# Patient Record
Sex: Female | Born: 1940 | Race: White | Hispanic: No | Smoking: Never smoker
Health system: Southern US, Community
[De-identification: ages and names within clinical notes are randomized; demographics above are authoritative.]

## PROBLEM LIST (undated history)

## (undated) DIAGNOSIS — H9190 Unspecified hearing loss, unspecified ear: Secondary | ICD-10-CM

## (undated) DIAGNOSIS — IMO0001 Reserved for inherently not codable concepts without codable children: Secondary | ICD-10-CM

## (undated) DIAGNOSIS — E785 Hyperlipidemia, unspecified: Secondary | ICD-10-CM

## (undated) DIAGNOSIS — E079 Disorder of thyroid, unspecified: Secondary | ICD-10-CM

## (undated) DIAGNOSIS — I10 Essential (primary) hypertension: Secondary | ICD-10-CM

---

## 2010-03-08 ENCOUNTER — Ambulatory Visit: Payer: Self-pay | Admitting: Family Medicine

## 2010-03-08 DIAGNOSIS — T6391XA Toxic effect of contact with unspecified venomous animal, accidental (unintentional), initial encounter: Secondary | ICD-10-CM | POA: Insufficient documentation

## 2010-03-08 DIAGNOSIS — E785 Hyperlipidemia, unspecified: Secondary | ICD-10-CM | POA: Insufficient documentation

## 2010-03-08 DIAGNOSIS — IMO0002 Reserved for concepts with insufficient information to code with codable children: Secondary | ICD-10-CM

## 2010-03-08 DIAGNOSIS — I10 Essential (primary) hypertension: Secondary | ICD-10-CM | POA: Insufficient documentation

## 2010-03-08 DIAGNOSIS — E119 Type 2 diabetes mellitus without complications: Secondary | ICD-10-CM

## 2010-03-10 ENCOUNTER — Telehealth (INDEPENDENT_AMBULATORY_CARE_PROVIDER_SITE_OTHER): Payer: Self-pay | Admitting: *Deleted

## 2010-08-01 NOTE — Assessment & Plan Note (Signed)
Summary: MULTI BEE STINGS ON RIGHT ARM/TJ (4)   Vital Signs:  Patient Profile:   70 Years Old Female CC:      bee stings to right FA and right middle finger x 2 days Height:     65 inches Weight:      131 pounds O2 Sat:      99 % O2 treatment:    Room Air Temp:     97.9 degrees F oral Pulse rate:   92 / minute Resp:     14 per minute BP sitting:   142 / 77  (left arm) Cuff size:   regular  Pt. in pain?   no  Vitals Entered By: Lajean Saver RN (March 08, 2010 11:30 AM)                   Updated Prior Medication List: LEVOTHYROXINE SODIUM 50 MCG TABS (LEVOTHYROXINE SODIUM) once daily LISINOPRIL 10 MG TABS (LISINOPRIL)  SIMVASTATIN 20 MG TABS (SIMVASTATIN)  METFORMIN HCL 500 MG TABS (METFORMIN HCL)  SERTRALINE HCL 50 MG TABS (SERTRALINE HCL)   Current Allergies: ! * SEASONALHistory of Present Illness Chief Complaint: bee stings to right FA and right middle finger x 2 days History of Present Illness:  Subjective:  Patient complains of several bee stings on the right forearm two days ago.  The area has remained red and continues to itch.  No improvement with Benadryl.  No swelling in tongue, difficulty swallowing, or shortness of breath.  No fevers, chills, and sweats  She does not remember her last tetanus shot.  REVIEW OF SYSTEMS Constitutional Symptoms      Denies fever, chills, night sweats, weight loss, weight gain, and fatigue.  Eyes       Denies change in vision, eye pain, eye discharge, glasses, contact lenses, and eye surgery. Ear/Nose/Throat/Mouth       Denies hearing loss/aids, change in hearing, ear pain, ear discharge, dizziness, frequent runny nose, frequent nose bleeds, sinus problems, sore throat, hoarseness, and tooth pain or bleeding.  Respiratory       Denies dry cough, productive cough, wheezing, shortness of breath, asthma, bronchitis, and emphysema/COPD.  Cardiovascular       Denies murmurs, chest pain, and tires easily with exhertion.     Gastrointestinal       Denies stomach pain, nausea/vomiting, diarrhea, constipation, blood in bowel movements, and indigestion. Genitourniary       Denies painful urination, kidney stones, and loss of urinary control. Neurological       Denies paralysis, seizures, and fainting/blackouts. Musculoskeletal       Denies muscle pain, joint pain, joint stiffness, decreased range of motion, redness, swelling, muscle weakness, and gout.  Skin       Denies bruising, unusual mles/lumps or sores, and hair/skin or nail changes.      Comments: bee stings Psych       Denies mood changes, temper/anger issues, anxiety/stress, speech problems, depression, and sleep problems. Other Comments: denies SOB or nausea or dizziness. C/o itching on right FA and right middle finger. Redness and swelling noted   Past History:  Past Medical History: Diabetes mellitus, type II Hypertension Hyperlipidemia  Social History: Never Smoked Alcohol use-no Drug use-no Smoking Status:  never Drug Use:  no   Objective:  Appearance:  No acute distress.  She is alert and oriented  Skin:  right volar forearm:   approximately 5cm by 15cm patch of erythema, slightly warm and nontender; no swelling. Eyes:  Pupils are equal, round, and reactive to light and accomdation.  Extraocular movement is intact.  Conjunctivae are not inflamed.  Mouth:  moist mucous membranes  Neck:  No adenopathy Lungs:  Clear to auscultation.  Breath sounds are equal.  Heart:  Regular rate and rhythm without murmurs, rubs, or gallops.  Assessment New Problems: CELLULITIS, ARM (ICD-682.3) BEE STING REACTION, LOCAL (ICD-989.5) HYPERLIPIDEMIA (ICD-272.4) HYPERTENSION (ICD-401.9) DIABETES MELLITUS, TYPE II (ICD-250.00)   Plan New Medications/Changes: CEPHALEXIN 500 MG CAPS (CEPHALEXIN) One by mouth two times a day  #14 x 0, 03/08/2010, Donna Christen MD  New Orders: New Patient Level III [99203] Tdap => 2yrs IM [90715] Admin 1st  Vaccine [90471] Planning Comments:   Tdap given. Begin Keflex.  Continue Benadryl (avoid sun exposure).  Continue cool compresses. Follow-up with PCP if not improving 3 to 5 days.   The patient and/or caregiver has been counseled thoroughly with regard to medications prescribed including dosage, schedule, interactions, rationale for use, and possible side effects and they verbalize understanding.  Diagnoses and expected course of recovery discussed and will return if not improved as expected or if the condition worsens. Patient and/or caregiver verbalized understanding.  Prescriptions: CEPHALEXIN 500 MG CAPS (CEPHALEXIN) One by mouth two times a day  #14 x 0   Entered and Authorized by:   Donna Christen MD   Signed by:   Donna Christen MD on 03/08/2010   Method used:   Print then Give to Patient   RxID:   [id number]   Patient Instructions: 1)  Continue taking Benadryl for itching.  2)  Continue applying cool compress several times daily. 3)  Followup with primary care doctor if not improving 3 to 5 days.  Orders Added: 1)  New Patient Level III [99203] 2)  Tdap => 32yrs IM [90715] 3)  Admin 1st Vaccine [90471]   Immunizations Administered:  Tetanus Vaccine:    Vaccine Type: Tdap    Site: left deltoid    Mfr: Gl[employer]oSmith[employer]    Dose: 0.5 ml    Route: IM    Given by: Lajean Saver RN    Exp. Date: 12/30/2011    Lot #: [id number]    VIS given: 05/20/07 version given March 08, 2010.

## 2010-08-01 NOTE — Miscellaneous (Signed)
Summary: TDAP FORM  TDAP FORM   Imported By: Dannette Barbara 03/08/2010 12:39:39  _____________________________________________________________________  External Attachment:    Type:   Image     Comment:   External Document

## 2010-08-01 NOTE — Progress Notes (Signed)
  Phone Note Outgoing Call Call back at [facility] Phone [phone]   Call placed by: Lajean Saver RN,  March 10, 2010 4:25 PM Call placed to: Patient  Follow-up for Phone Call        Phone call completed Patient is improving and is still taking medication prescribed.  Follow-up by: Lajean Saver RN,  March 10, 2010 4:26 PM

## 2010-12-12 ENCOUNTER — Inpatient Hospital Stay (INDEPENDENT_AMBULATORY_CARE_PROVIDER_SITE_OTHER)
Admission: RE | Admit: 2010-12-12 | Discharge: 2010-12-12 | Disposition: A | Discharge status: Home / Self Care | Payer: Medicare Other | Source: Ambulatory Visit | Attending: Emergency Medicine | Admitting: Emergency Medicine

## 2010-12-12 ENCOUNTER — Encounter: Payer: Self-pay | Admitting: Emergency Medicine

## 2010-12-12 DIAGNOSIS — L259 Unspecified contact dermatitis, unspecified cause: Secondary | ICD-10-CM | POA: Insufficient documentation

## 2010-12-12 DIAGNOSIS — IMO0002 Reserved for concepts with insufficient information to code with codable children: Secondary | ICD-10-CM

## 2011-02-10 ENCOUNTER — Encounter: Payer: Self-pay | Admitting: Family Medicine

## 2011-02-10 ENCOUNTER — Inpatient Hospital Stay (INDEPENDENT_AMBULATORY_CARE_PROVIDER_SITE_OTHER)
Admission: RE | Admit: 2011-02-10 | Discharge: 2011-02-10 | Disposition: A | Discharge status: Home / Self Care | Payer: Medicare Other | Source: Ambulatory Visit | Attending: Emergency Medicine | Admitting: Emergency Medicine

## 2011-02-10 ENCOUNTER — Inpatient Hospital Stay: Admission: RE | Admit: 2011-02-10 | Payer: Medicare Other | Source: Ambulatory Visit | Admitting: Family Medicine

## 2011-02-10 DIAGNOSIS — J069 Acute upper respiratory infection, unspecified: Secondary | ICD-10-CM

## 2011-06-04 NOTE — Progress Notes (Signed)
Summary: COUGH/SORE THROAT(rm4)   Vital Signs:  Patient Profile:   70 Years Old Female CC:      cough-sore throat Height:     65 inches Weight:      135.4 pounds O2 Sat:      95 % O2 treatment:    Room Air Temp:     99.7 degrees F oral Pulse rate:   100 / minute Resp:     20 per minute BP sitting:   151 / 84  (right arm) Cuff size:   coughregular  Vitals Entered By: Linton Flemings RN (February 10, 2011 10:02 AM)                  Updated Prior Medication List: LEVOTHYROXINE SODIUM 50 MCG TABS (LEVOTHYROXINE SODIUM) once daily LISINOPRIL 10 MG TABS (LISINOPRIL)  SIMVASTATIN 20 MG TABS (SIMVASTATIN)  METFORMIN HCL 500 MG TABS (METFORMIN HCL)  SERTRALINE HCL 50 MG TABS (SERTRALINE HCL)  ALENDRONATE SODIUM 70 MG TABS (ALENDRONATE SODIUM)  LOVASTATIN 20 MG TABS (LOVASTATIN)  TRIAMCINOLONE ACETONIDE 0.1 % CREA (TRIAMCINOLONE ACETONIDE) apply to affected area two times a day VESICARE 5 MG TABS (SOLIFENACIN SUCCINATE)   Current Allergies: ! * SEASONALHistory of Present Illness Chief Complaint: cough-sore throat History of Present Illness: 70 yo F here for 3 days of dry coughsore throat, runny nose.  Has a friend with similar symptoms who was diagnosed with bronchitis.  No fever, chills, chest pain, wheezing.  + mild ear pressure.  No other complaints.  REVIEW OF SYSTEMS Constitutional Symptoms      Denies fever, chills, night sweats, weight loss, weight gain, and fatigue.  Eyes       Complains of glasses.      Denies change in vision, eye pain, eye discharge, contact lenses, and eye surgery. Ear/Nose/Throat/Mouth       Complains of change in hearing, frequent runny nose, sinus problems, and sore throat.      Denies hearing loss/aids, ear pain, ear discharge, dizziness, frequent nose bleeds, hoarseness, and tooth pain or bleeding.  Respiratory       Complains of dry cough.      Denies productive cough, wheezing, shortness of breath, asthma, bronchitis, and emphysema/COPD.    Cardiovascular       Denies murmurs, chest pain, and tires easily with exhertion.    Gastrointestinal       Denies stomach pain, nausea/vomiting, diarrhea, constipation, blood in bowel movements, and indigestion. Genitourniary       Denies painful urination, kidney stones, and loss of urinary control. Neurological       Denies paralysis, seizures, and fainting/blackouts. Musculoskeletal       Denies muscle pain, joint pain, joint stiffness, decreased range of motion, redness, swelling, muscle weakness, and gout.  Skin       Denies bruising, unusual mles/lumps or sores, and hair/skin or nail changes.  Psych       Complains of depression.      Denies mood changes, temper/anger issues, anxiety/stress, speech problems, and sleep problems. Other Comments: cough x 3 days- denies [facility]   Past History:  Past Medical History: Reviewed history from 12/12/2010 and no changes required. Diabetes mellitus, type II Hypertension Hyperlipidemia over active bladder  Past Surgical History: Reviewed history from 12/12/2010 and no changes required. Denies surgical history Physical Exam General appearance: well developed, well nourished, no acute distress, sniffling Head: normocephalic, atraumatic Eyes: conjunctivae and lids normal Ears: normal, no lesions or deformities Nasal: mucus drainage Oral/Pharynx: tongue normal, posterior  pharynx without erythema or exudate Neck: neck supple,  trachea midline, no masses or LAN Chest/Lungs: no rales, wheezes, or rhonchi bilateral, breath sounds equal without effort Heart: regular rate and  rhythm, no murmur Assessment New Problems: URI (ICD-465.9)  URI  Plan New Orders: Est. Patient Level III [40981] Planning Comments:   discussed OTC meds - avoid sudafed with high blood pressure.  If not improving after total 7 days or if gets second sickening, to fill azithromycin and take as directed.  tessalon as needed for cough.  Humidifier.  See  instructions for further.   The patient and/or caregiver has been counseled thoroughly with regard to medications prescribed including dosage, schedule, interactions, rationale for use, and possible side effects and they verbalize understanding.  Diagnoses and expected course of recovery discussed and will return if not improved as expected or if the condition worsens. Patient and/or caregiver verbalized understanding.   Patient Instructions: 1)  In most cases, your symptoms (bronchitis, cold) are caused by a viral infection that will resolve on its own over time. 2)  Afrin up to 5 days may help with congestion. 3)  Use mucinex or robitussin DM for cough.  Tessalon perles is a prescription cough medicine as well.  4)  Sudafed may be helpful if you don't have high blood pressure. 5)  Drink plenty of fluids and stay hydrated! 6)  Antibiotics are not helpful for viral infections.   7)  But if not improving after a total of 7 days or if you start to improve then worsen again, fill the azithromycin. 8)  Wash your hands frequently. 9)  Symptoms usually last 3-7 days but can last up to 2-3 weeks.  Orders Added: 1)  Est. Patient Level III [19147]

## 2011-06-04 NOTE — Progress Notes (Signed)
Summary: rash rm 5   Vital Signs:  Patient Profile:   70 Years Old Female CC:      rash x 2 days Height:     65 inches Weight:      136 pounds O2 Sat:      98 % O2 treatment:    Room Air Temp:     97.8 degrees F oral Pulse rate:   86 / minute Resp:     16 per minute BP sitting:   146 / 79  (left arm) Cuff size:   regular  Vitals Entered By: Clemens Catholic LPN (December 12, 2010 5:43 PM)                  Updated Prior Medication List: LEVOTHYROXINE SODIUM 50 MCG TABS (LEVOTHYROXINE SODIUM) once daily LISINOPRIL 10 MG TABS (LISINOPRIL)  SIMVASTATIN 20 MG TABS (SIMVASTATIN)  METFORMIN HCL 500 MG TABS (METFORMIN HCL)  SERTRALINE HCL 50 MG TABS (SERTRALINE HCL)  ALENDRONATE SODIUM 70 MG TABS (ALENDRONATE SODIUM)  LOVASTATIN 20 MG TABS (LOVASTATIN)   Current Allergies (reviewed today): ! * SEASONALHistory of Present Illness History from: patient Chief Complaint: rash x 2 days History of Present Illness: She was working in her flower garden pulling weeds a few days ago, then developed a rash on the R side of her neck and R hand.  Mildly itchy, not painful.  No F/C/N/V.  Other than the rash, she has no concerns.  She is not taking any OTC meds.  No new soaps, detergents, shampoos, clothes.  She has started Vesicare in the last few weeks but no other new meds.  REVIEW OF SYSTEMS Constitutional Symptoms      Denies fever, chills, night sweats, weight loss, weight gain, and fatigue.  Eyes       Complains of glasses.      Denies change in vision, eye pain, eye discharge, contact lenses, and eye surgery. Ear/Nose/Throat/Mouth       Complains of change in hearing and sinus problems.      Denies hearing loss/aids, ear pain, ear discharge, dizziness, frequent runny nose, frequent nose bleeds, sore throat, hoarseness, and tooth pain or bleeding.  Respiratory       Denies dry cough, productive cough, wheezing, shortness of breath, asthma, bronchitis, and emphysema/COPD.  Cardiovascular      Denies murmurs, chest pain, and tires easily with exhertion.    Gastrointestinal       Denies stomach pain, nausea/vomiting, diarrhea, constipation, blood in bowel movements, and indigestion. Genitourniary       Denies painful urination, kidney stones, and loss of urinary control. Neurological       Denies paralysis, seizures, and fainting/blackouts. Musculoskeletal       Denies muscle pain, joint pain, joint stiffness, decreased range of motion, redness, swelling, muscle weakness, and gout.  Skin       Denies bruising, unusual mles/lumps or sores, and hair/skin or nail changes.  Psych       Complains of anxiety/stress and depression.      Denies mood changes, temper/anger issues, speech problems, and sleep problems. Other Comments: pt c/o rash on her face and neck x 2 days. no OTC meds.   Past History:  Past Medical History: Diabetes mellitus, type II Hypertension Hyperlipidemia over active bladder  Past Surgical History: Denies surgical history  Social History: Reviewed history from 03/08/2010 and no changes required. Never Smoked Alcohol use-no Drug use-no Physical Exam General appearance: well developed, well nourished, no acute distress Skin:  Slightly erythematous rash on the R side of her neck, on her dorsal R hand, and on her R forearm. No signs of infection or cellulitis MSE: oriented to time, place, and person.  She has a baseline speech impediment Assessment New Problems: CONTACT DERMATITIS (ICD-692.9)   Plan New Medications/Changes: TRIAMCINOLONE ACETONIDE 0.1 % CREA (TRIAMCINOLONE ACETONIDE) apply to affected area two times a day  #QS x3 wks x 0, 12/12/2010, Hoyt Koch MD  New Orders: Est. Patient Level III [phone] Planning Comments:   Her rash is very localized, so I think we can try a steroid cream first.  Avoid use on her face.  If worsening, call your PCP or dermatologist to consider prednisone or other further treatment.   The patient  and/or caregiver has been counseled thoroughly with regard to medications prescribed including dosage, schedule, interactions, rationale for use, and possible side effects and they verbalize understanding.  Diagnoses and expected course of recovery discussed and will return if not improved as expected or if the condition worsens. Patient and/or caregiver verbalized understanding.  Prescriptions: TRIAMCINOLONE ACETONIDE 0.1 % CREA (TRIAMCINOLONE ACETONIDE) apply to affected area two times a day  #QS x3 wks x 0   Entered and Authorized by:   Hoyt Koch MD   Signed by:   Hoyt Koch MD on 12/12/2010   Method used:   Print then Give to Patient   RxID:   [id number]   Orders Added: 1)  Est. Patient Level III [78469]

## 2012-02-03 ENCOUNTER — Emergency Department (INDEPENDENT_AMBULATORY_CARE_PROVIDER_SITE_OTHER)
Admission: EM | Admit: 2012-02-03 | Discharge: 2012-02-03 | Disposition: A | Discharge status: Home / Self Care | Payer: Medicare Other | Source: Home / Self Care | Attending: Family Medicine | Admitting: Family Medicine

## 2012-02-03 ENCOUNTER — Emergency Department (INDEPENDENT_AMBULATORY_CARE_PROVIDER_SITE_OTHER): Payer: Medicare Other

## 2012-02-03 DIAGNOSIS — M25562 Pain in left knee: Secondary | ICD-10-CM

## 2012-02-03 DIAGNOSIS — M25569 Pain in unspecified knee: Secondary | ICD-10-CM

## 2012-02-03 DIAGNOSIS — M7989 Other specified soft tissue disorders: Secondary | ICD-10-CM

## 2012-02-03 HISTORY — DX: Disorder of thyroid, unspecified: E07.9

## 2012-02-03 HISTORY — DX: Hyperlipidemia, unspecified: E78.5

## 2012-02-03 HISTORY — DX: Essential (primary) hypertension: I10

## 2012-02-03 MED ORDER — NYSTATIN 100000 UNIT/GM EX OINT: TOPICAL_OINTMENT | Freq: Two times a day (BID) | CUTANEOUS | Status: DC

## 2012-02-03 MED ORDER — NAPROXEN 250 MG PO TABS: 250.0000 mg | ORAL_TABLET | Freq: Two times a day (BID) | ORAL | Status: AC

## 2012-02-03 MED ORDER — CEPHALEXIN 500 MG PO CAPS: 500.0000 mg | ORAL_CAPSULE | Freq: Three times a day (TID) | ORAL | Status: DC

## 2012-02-03 NOTE — ED Provider Notes (Signed)
History     CSN: [id number]  Arrival date & time 02/03/12  1503   First MD Initiated Contact with Patient 02/03/12 1532      Chief Complaint  Patient presents with  . Knee Pain    3 days      HPI Comments: Alishea complains of left knee pain for 3 days. She states she injured her knee 6 months ago.   Patient is a 71 y.o. female presenting with knee pain. The history is provided by the patient.  Knee Pain This is a new problem. Episode onset: 3 days ago. The problem occurs constantly. The problem has not changed since onset.Associated symptoms comments: none. The symptoms are aggravated by bending and walking. Nothing relieves the symptoms. She has tried acetaminophen for the symptoms. The treatment provided no relief.    Past Medical History  Diagnosis Date  . Hypertension   . Thyroid disease   . Hyperlipidemia   . Diabetes mellitus     History reviewed. No pertinent past surgical history.  Family History  Problem Relation Age of Onset  . Heart disease Mother   . Heart disease Father     History  Substance Use Topics  . Smoking status: Never Smoker   . Smokeless tobacco: Never Used  . Alcohol Use: No    OB History    Grav Para Term Preterm Abortions TAB SAB Ect Mult Living                  Review of Systems  All other systems reviewed and are negative.    Allergies  Lipitor  Home Medications   Current Outpatient Rx  Name Route Sig Dispense Refill  . ALENDRONATE SODIUM 70 MG PO TABS Oral Take 70 mg by mouth every 7 (seven) days. Take with a full glass of water on an empty stomach.    Marland Kitchen BRIMONIDINE TARTRATE 0.1 % OP SOLN Both Eyes Place 1 drop into both eyes 2 (two) times daily.    Marland Kitchen CALCIUM CARBONATE 600 MG PO TABS Oral Take 600 mg by mouth daily.    Marland Kitchen LEVOTHYROXINE SODIUM 50 MCG PO TABS Oral Take 50 mcg by mouth daily.    Marland Kitchen LISINOPRIL 20 MG PO TABS Oral Take 20 mg by mouth daily.    Marland Kitchen LOVASTATIN 20 MG PO TABS Oral Take 20 mg by mouth at bedtime.    Marland Kitchen  METFORMIN HCL ER (OSM) 500 MG PO TB24 Oral Take 500 mg by mouth daily with breakfast.    . NAPROXEN 250 MG PO TABS Oral Take 1 tablet (250 mg total) by mouth 2 (two) times daily with a meal. 14 tablet 0    BP 142/78  Pulse 87  Temp 97.9 F (36.6 C) (Oral)  Resp 16  Ht 5\' 5"  (1.651 m)  Wt 133 lb (60.328 kg)  BMI 22.13 kg/m2  SpO2 100%  Physical Exam  Nursing note and vitals reviewed. Constitutional: She is oriented to person, place, and time. She appears well-developed and well-nourished. No distress.  HENT:  Head: Normocephalic.  Eyes: Conjunctivae are normal. Pupils are equal, round, and reactive to light.  Cardiovascular: Normal heart sounds.   Pulmonary/Chest: Breath sounds normal.  Musculoskeletal: Normal range of motion. She exhibits tenderness. She exhibits no edema.       Left knee: She exhibits swelling. She exhibits normal range of motion, no effusion, no ecchymosis, no deformity, no laceration, no erythema, normal alignment, no LCL laxity, normal patellar mobility, no bony tenderness,  normal meniscus and no MCL laxity. tenderness found. Medial joint line tenderness noted. No lateral joint line, no MCL, no LCL and no patellar tendon tenderness noted.       Legs:      Left knee has mild tenderness over the medial joint line.  There is tenderness with valgus stress.  Negative drawer.  Negative McMurray.  No calf tenderness.  Distal Neurovascular function is intact.   Neurological: She is alert and oriented to person, place, and time.  Skin: Skin is warm and dry. No rash noted.    ED Course  Procedures  none  Labs Reviewed - No data to display Dg Knee 2 Views Left  02/03/2012  *RADIOLOGY REPORT*  Clinical Data: Knee pain for past 6-7 months.  LEFT KNEE - 1-2 VIEW  Comparison: No priors.  Findings: AP and lateral views of the left knee demonstrates soft tissue swelling medial to the knee joint.  No acute fracture, subluxation or dislocation is appreciated.  IMPRESSION: 1.  Soft  tissue swelling medial to the knee joint without definite underlying bony abnormality.  Original Report Authenticated By: Florencia Reasons, M.D.     1. Left knee pain; suspect osteoarthritis       MDM  Neoprene knee sleeve applied.  Begin Naproxen Apply ice pack 3 or 4 times daily for about 20 minutes.  Wear knee brace daytime. Followup with orthopedist if not improved 2 weeks.        Lattie Haw, MD 02/06/12 [phone]

## 2012-02-03 NOTE — ED Notes (Signed)
Telia complains of left knee pain for 3 days. She states she injured her knee 6 months ago.

## 2013-02-07 ENCOUNTER — Emergency Department (INDEPENDENT_AMBULATORY_CARE_PROVIDER_SITE_OTHER)
Admission: EM | Admit: 2013-02-07 | Discharge: 2013-02-07 | Disposition: A | Discharge status: Home / Self Care | Payer: Medicare Other | Source: Home / Self Care | Attending: Family Medicine | Admitting: Family Medicine

## 2013-02-07 ENCOUNTER — Emergency Department (INDEPENDENT_AMBULATORY_CARE_PROVIDER_SITE_OTHER): Payer: Medicare Other

## 2013-02-07 ENCOUNTER — Encounter: Payer: Self-pay | Admitting: *Deleted

## 2013-02-07 DIAGNOSIS — S52501A Unspecified fracture of the lower end of right radius, initial encounter for closed fracture: Secondary | ICD-10-CM

## 2013-02-07 DIAGNOSIS — S52599A Other fractures of lower end of unspecified radius, initial encounter for closed fracture: Secondary | ICD-10-CM

## 2013-02-07 DIAGNOSIS — S52509A Unspecified fracture of the lower end of unspecified radius, initial encounter for closed fracture: Secondary | ICD-10-CM

## 2013-02-07 DIAGNOSIS — W19XXXA Unspecified fall, initial encounter: Secondary | ICD-10-CM

## 2013-02-07 MED ORDER — HYDROCODONE-ACETAMINOPHEN 5-325 MG PO TABS: ORAL_TABLET | ORAL | Status: DC

## 2013-02-07 NOTE — ED Provider Notes (Signed)
CSN: [id number]     Arrival date & time 02/07/13  1235 History     First MD Initiated Contact with Patient 02/07/13 1328     Chief Complaint  Patient presents with  . Fall      HPI Comments: Patient fell this morning on a slippery floor, injuring right wrist and forearm.  No loss of consciousness. She notes that she takes a calcium supplement with Vitamin D  Patient is a 72 y.o. female presenting with wrist pain. The history is provided by the patient.  Wrist Pain This is a new problem. The current episode started 3 to 5 hours ago. The problem occurs constantly. The problem has not changed since onset.Associated symptoms comments: none. Exacerbated by: movement of right wrist. Nothing relieves the symptoms. Treatments tried: ice pack. The treatment provided no relief.    Past Medical History  Diagnosis Date  . Hypertension   . Thyroid disease   . Hyperlipidemia   . Diabetes mellitus    History reviewed. No pertinent past surgical history. Family History  Problem Relation Age of Onset  . Heart disease Mother   . Heart disease Father    History  Substance Use Topics  . Smoking status: Never Smoker   . Smokeless tobacco: Never Used  . Alcohol Use: No   OB History   Grav Para Term Preterm Abortions TAB SAB Ect Mult Living                 Review of Systems  All other systems reviewed and are negative.    Allergies  Lipitor  Home Medications   Current Outpatient Rx  Name  Route  Sig  Dispense  Refill  . alendronate (FOSAMAX) 70 MG tablet   Oral   Take 70 mg by mouth every 7 (seven) days. Take with a full glass of water on an empty stomach.         . brimonidine (ALPHAGAN P) 0.1 % SOLN   Both Eyes   Place 1 drop into both eyes 2 (two) times daily.         . calcium carbonate (OS-CAL) 600 MG TABS   Oral   Take 600 mg by mouth daily.         Marland Kitchen HYDROcodone-acetaminophen (NORCO/VICODIN) 5-325 MG per tablet      Take one-half to one tab by mouth at bedtime  as needed for pain   8 tablet   0   . levothyroxine (SYNTHROID, LEVOTHROID) 50 MCG tablet   Oral   Take 50 mcg by mouth daily.         Marland Kitchen lisinopril (PRINIVIL,ZESTRIL) 20 MG tablet   Oral   Take 20 mg by mouth daily.         Marland Kitchen lovastatin (MEVACOR) 20 MG tablet   Oral   Take 20 mg by mouth at bedtime.         . metformin (FORTAMET) 500 MG (OSM) 24 hr tablet   Oral   Take 500 mg by mouth daily with breakfast.          BP 131/73  Pulse 71  Temp(Src) 97.5 F (36.4 C) (Oral)  Resp 14  Ht 5\' 4"  (1.626 m)  Wt 128 lb 8 oz (58.287 kg)  BMI 22.05 kg/m2  SpO2 100% Physical Exam  Nursing note and vitals reviewed. Constitutional: She is oriented to person, place, and time. She appears well-developed and well-nourished. No distress.  HENT:  Head: Atraumatic.  Mouth/Throat: Oropharynx is  clear and moist.  Eyes: Conjunctivae are normal. Pupils are equal, round, and reactive to light.  Neck:  nontender  Cardiovascular: Normal heart sounds.   Pulmonary/Chest: Breath sounds normal.  Musculoskeletal: She exhibits tenderness.       Right wrist: She exhibits decreased range of motion, tenderness and bony tenderness. She exhibits no swelling, no effusion, no crepitus, no deformity and no laceration.  Right wrist reveals tenderness dorsally over radius and ulna.  Minimal swelling present.  Distal neurovascular function is intact.   Neurological: She is alert and oriented to person, place, and time.  Skin: Skin is warm and dry.    ED Course   Procedures  Fabricated and applied double sugar tong splint to right arm/wrist.a  Labs Reviewed - No data to display Dg Wrist Complete Right  02/07/2013   *RADIOLOGY REPORT*  Clinical Data: Tenderness post fall.  RIGHT WRIST - COMPLETE 3+ VIEW  Comparison: None.  Findings: Transverse fracture of the distal radial metaphysis, impacted with mild dorsal angulation of the distal radial articular surface.  No dislocation.  Carpal rows intact.   There is an ulnar styloid avulsion fracture, minimally distracted.  Mild diffuse osteopenia.  IMPRESSION:  1.  Impacted transverse distal radial fracture with dorsal angulation of the distal articular surface. 2.  Minimally distracted ulnar styloid avulsion fracture.   Original Report Authenticated By: D. Andria Rhein, MD   1. Fracture of distal radius and ulna, right, closed, initial encounter     MDM  Fabricated and applied a double sugar tong splint to right arm/wrist.  Dispensed sling. Rx for low dose Lortab at bedtime if needed for pain Followup with Dr. Rodney Langton in 2 days.  Lattie Haw, MD 02/07/13 [phone]

## 2013-02-07 NOTE — ED Notes (Signed)
Patient c/o fall at home today and landed on right arm.

## 2013-02-09 ENCOUNTER — Telehealth: Payer: Self-pay | Admitting: Emergency Medicine

## 2013-02-09 ENCOUNTER — Encounter: Payer: Self-pay | Admitting: Sports Medicine

## 2013-02-09 ENCOUNTER — Ambulatory Visit (INDEPENDENT_AMBULATORY_CARE_PROVIDER_SITE_OTHER): Payer: Medicare Other | Admitting: Sports Medicine

## 2013-02-09 VITALS — BP 121/78 | HR 109 | Wt 129.0 lb

## 2013-02-09 DIAGNOSIS — S5290XA Unspecified fracture of unspecified forearm, initial encounter for closed fracture: Secondary | ICD-10-CM

## 2013-02-09 DIAGNOSIS — S52201A Unspecified fracture of shaft of right ulna, initial encounter for closed fracture: Secondary | ICD-10-CM | POA: Insufficient documentation

## 2013-02-09 NOTE — Progress Notes (Signed)
   Subjective:    I'm seeing this patient as a consultation for:  Dr. Cathren Harsh  CC: Right radius fracture  HPI: This is a pleasant 72 year old female, on Saturday she fell, injured her right arm. She went to urgent care where x-rays showed a fracture of the radius and ulna. She was placed in a sugar tong as well as a coaptation splint and referred to me for further evaluation and definitive treatment. Pain is mild to moderate, swelling has resolved. Pain does radiate into the forearm. Persistent.  Past medical history, Surgical history, Family history not pertinant except as noted below, Social history, Allergies, and medications have been entered into the medical record, reviewed, and no changes needed.   Review of Systems: No headache, visual changes, nausea, vomiting, diarrhea, constipation, dizziness, abdominal pain, skin rash, fevers, chills, night sweats, weight loss, swollen lymph nodes, body aches, joint swelling, muscle aches, chest pain, shortness of breath, mood changes, visual or auditory hallucinations.   Objective:   General: Well Developed, well nourished, and in no acute distress.  Neuro/Psych: Alert and oriented x3, extra-ocular muscles intact, able to move all 4 extremities, sensation grossly intact. Skin: Warm and dry, no rashes noted.  Respiratory: Not using accessory muscles, speaking in full sentences, trachea midline.  Cardiovascular: Pulses palpable, no extremity edema. Abdomen: Does not appear distended. Right wrist: There is no visible deformity, there is tenderness to palpation of the distal radius as well as ulnar styloid process of the fracture lines. She is neurovascularly intact distally.  X-rays reviewed and there is impacted fracture of the distal radius with very minimal apex volar angulation, there is also an avulsion from the ulnar styloid process.  Short arm cast was placed.  Impression and Recommendations:   This case required medical decision making of  moderate complexity.

## 2013-02-09 NOTE — Assessment & Plan Note (Signed)
Short arm cast placed. Return in 3 weeks.  I billed a fracture code for this visit, all subsequent visits for this complaint will be "post-op checks" in the global period.

## 2013-03-03 ENCOUNTER — Ambulatory Visit (INDEPENDENT_AMBULATORY_CARE_PROVIDER_SITE_OTHER): Payer: Medicare Other

## 2013-03-03 ENCOUNTER — Ambulatory Visit: Payer: Medicare Other | Admitting: Sports Medicine

## 2013-03-03 ENCOUNTER — Encounter: Payer: Self-pay | Admitting: Sports Medicine

## 2013-03-03 VITALS — BP 126/69 | HR 76 | Wt 129.0 lb

## 2013-03-03 DIAGNOSIS — S52201D Unspecified fracture of shaft of right ulna, subsequent encounter for closed fracture with routine healing: Secondary | ICD-10-CM

## 2013-03-03 DIAGNOSIS — S5290XD Unspecified fracture of unspecified forearm, subsequent encounter for closed fracture with routine healing: Secondary | ICD-10-CM

## 2013-03-03 NOTE — Progress Notes (Signed)
  Subjective: Three-week status post fractures of the distal radius and ulnar. Short-arm cast is comfortable.   Objective: General: Well-developed, well-nourished, and in no acute distress. Cast is in good shape, neurovascularly intact distally.  Assessment/plan:

## 2013-03-03 NOTE — Assessment & Plan Note (Signed)
Repeat x-rays. Continuing cast for an additional 2 weeks, I will see her back in 2 weeks and we can consider removing the cast and transitioning into a Velcro brace.

## 2013-03-17 ENCOUNTER — Encounter: Payer: Self-pay | Admitting: Sports Medicine

## 2013-03-17 ENCOUNTER — Ambulatory Visit: Payer: Medicare Other | Admitting: Sports Medicine

## 2013-03-17 VITALS — BP 129/75 | HR 91 | Wt 127.0 lb

## 2013-03-17 DIAGNOSIS — S52201D Unspecified fracture of shaft of right ulna, subsequent encounter for closed fracture with routine healing: Secondary | ICD-10-CM

## 2013-03-17 NOTE — Assessment & Plan Note (Signed)
Short arm cast removed. She needs to stay in a Velcro wrist brace for another 2 weeks at least. Return to see me in 2 weeks, x-ray before visit.

## 2013-03-17 NOTE — Progress Notes (Signed)
  Subjective: 5 weeks status post fractures of the distal radius and ulna, she's been in a short arm cast for 5 weeks now. No pain.   Objective: General: Well-developed, well-nourished, and in no acute distress. Cast is removed, there is no further swelling, there are no areas of tenderness to palpation. She still lacks range of motion, and has pain with flexion or extension passed approximately 20.  We placed her back into a Velcro wrist brace.  Assessment/plan:

## 2013-03-31 ENCOUNTER — Ambulatory Visit: Payer: Medicare Other | Admitting: Sports Medicine

## 2013-03-31 ENCOUNTER — Encounter: Payer: Self-pay | Admitting: Sports Medicine

## 2013-03-31 ENCOUNTER — Ambulatory Visit (INDEPENDENT_AMBULATORY_CARE_PROVIDER_SITE_OTHER): Payer: Medicare Other

## 2013-03-31 VITALS — BP 116/71 | HR 76 | Wt 122.0 lb

## 2013-03-31 DIAGNOSIS — S52201D Unspecified fracture of shaft of right ulna, subsequent encounter for closed fracture with routine healing: Secondary | ICD-10-CM

## 2013-03-31 DIAGNOSIS — S5290XD Unspecified fracture of unspecified forearm, subsequent encounter for closed fracture with routine healing: Secondary | ICD-10-CM

## 2013-03-31 DIAGNOSIS — M21839 Other specified acquired deformities of unspecified forearm: Secondary | ICD-10-CM

## 2013-03-31 NOTE — Assessment & Plan Note (Signed)
Tylenol seems to be working well for pain. She is now 7 weeks out from fracture, I'd like her to keep the brace on for an additional 2 weeks, and then start physical therapy to work on increasing range of motion and strength. She will likely develop arthritis in this joints, we can start pursue injections down the road if continues to be painful after about 2-3 months.Marland Kitchen

## 2013-03-31 NOTE — Progress Notes (Signed)
  Subjective: 7 weeks after fractures of the distal radius and ulnar styloid process, pain continues to improve.   Objective: General: Well-developed, well-nourished, and in no acute distress. Wrist looks pretty good, swelling has resolved, she has about 10 of flexion approximately 10 of extension, very little ulnar or radial deviation, this is painful. Neurovascularly intact distally.  X-rays do show a slight increase in apex volar angulation of the distal radius fracture. I do see signs of bone healing so I do not suspect any further angulation will occur.  Assessment/plan:

## 2013-04-08 ENCOUNTER — Ambulatory Visit: Payer: Medicare Other | Admitting: Physical Therapy

## 2013-04-08 DIAGNOSIS — M2560 Stiffness of unspecified joint, not elsewhere classified: Secondary | ICD-10-CM

## 2013-04-08 DIAGNOSIS — M6281 Muscle weakness (generalized): Secondary | ICD-10-CM

## 2013-04-08 DIAGNOSIS — S5290XD Unspecified fracture of unspecified forearm, subsequent encounter for closed fracture with routine healing: Secondary | ICD-10-CM

## 2013-04-08 DIAGNOSIS — M25539 Pain in unspecified wrist: Secondary | ICD-10-CM

## 2013-04-08 DIAGNOSIS — R609 Edema, unspecified: Secondary | ICD-10-CM

## 2013-04-15 ENCOUNTER — Encounter: Payer: Medicare Other | Admitting: Physical Therapy

## 2013-04-15 DIAGNOSIS — M25539 Pain in unspecified wrist: Secondary | ICD-10-CM

## 2013-04-15 DIAGNOSIS — R609 Edema, unspecified: Secondary | ICD-10-CM

## 2013-04-15 DIAGNOSIS — M6281 Muscle weakness (generalized): Secondary | ICD-10-CM

## 2013-04-15 DIAGNOSIS — S5290XD Unspecified fracture of unspecified forearm, subsequent encounter for closed fracture with routine healing: Secondary | ICD-10-CM

## 2013-04-15 DIAGNOSIS — M2560 Stiffness of unspecified joint, not elsewhere classified: Secondary | ICD-10-CM

## 2013-04-17 ENCOUNTER — Encounter: Payer: Medicare Other | Admitting: Physical Therapy

## 2013-04-17 DIAGNOSIS — M2560 Stiffness of unspecified joint, not elsewhere classified: Secondary | ICD-10-CM

## 2013-04-17 DIAGNOSIS — S5290XD Unspecified fracture of unspecified forearm, subsequent encounter for closed fracture with routine healing: Secondary | ICD-10-CM

## 2013-04-17 DIAGNOSIS — M6281 Muscle weakness (generalized): Secondary | ICD-10-CM

## 2013-04-17 DIAGNOSIS — R609 Edema, unspecified: Secondary | ICD-10-CM

## 2013-04-20 ENCOUNTER — Encounter: Payer: Medicare Other | Admitting: Physical Therapy

## 2013-04-22 ENCOUNTER — Encounter: Payer: Medicare Other | Admitting: Physical Therapy

## 2013-04-28 ENCOUNTER — Encounter: Payer: Self-pay | Admitting: Sports Medicine

## 2013-04-28 ENCOUNTER — Ambulatory Visit: Payer: Medicare Other | Admitting: Sports Medicine

## 2013-04-28 VITALS — BP 123/66 | HR 76 | Wt 128.0 lb

## 2013-04-28 DIAGNOSIS — S52201D Unspecified fracture of shaft of right ulna, subsequent encounter for closed fracture with routine healing: Secondary | ICD-10-CM

## 2013-04-28 DIAGNOSIS — G5601 Carpal tunnel syndrome, right upper limb: Secondary | ICD-10-CM | POA: Insufficient documentation

## 2013-04-28 NOTE — Progress Notes (Addendum)
  Subjective: 9 weeks status post fractures of the distal radius and ulna, doing well, essentially pain-free. Has been doing home physical therapy and slowly gaining some of her range of motion. She does have some numbness and tingling into her first through fourth fingers.   Objective: General: Well-developed, well-nourished, and in no acute distress. Wrist braces removed, she has approximately 20 of flexion and 10 of extension. She does have a positive Phalen's and Tinel's signs.  Assessment/plan:

## 2013-04-28 NOTE — Assessment & Plan Note (Addendum)
Healed well. Return in 6 weeks to further evaluate range of motion. I am going to have formal PT to help regain strength and range of motion.

## 2013-04-28 NOTE — Assessment & Plan Note (Signed)
Some of this may be posttraumatic and related to swelling from her wrist fracture. If still present in 6 weeks I will inject around the median nerve.

## 2013-04-28 NOTE — Addendum Note (Signed)
Addended by: Monica Becton on: 04/28/2013 11:13 AM   Modules accepted: Orders

## 2013-05-06 ENCOUNTER — Encounter: Payer: Medicare Other | Admitting: Physical Therapy

## 2013-05-06 DIAGNOSIS — M6281 Muscle weakness (generalized): Secondary | ICD-10-CM

## 2013-05-06 DIAGNOSIS — M25539 Pain in unspecified wrist: Secondary | ICD-10-CM

## 2013-05-06 DIAGNOSIS — R609 Edema, unspecified: Secondary | ICD-10-CM

## 2013-05-06 DIAGNOSIS — S5290XD Unspecified fracture of unspecified forearm, subsequent encounter for closed fracture with routine healing: Secondary | ICD-10-CM

## 2013-05-06 DIAGNOSIS — M2560 Stiffness of unspecified joint, not elsewhere classified: Secondary | ICD-10-CM

## 2013-05-11 ENCOUNTER — Encounter: Payer: Medicare Other | Admitting: Physical Therapy

## 2013-05-11 DIAGNOSIS — M2560 Stiffness of unspecified joint, not elsewhere classified: Secondary | ICD-10-CM

## 2013-05-11 DIAGNOSIS — R609 Edema, unspecified: Secondary | ICD-10-CM

## 2013-05-11 DIAGNOSIS — M25539 Pain in unspecified wrist: Secondary | ICD-10-CM

## 2013-05-11 DIAGNOSIS — S5290XD Unspecified fracture of unspecified forearm, subsequent encounter for closed fracture with routine healing: Secondary | ICD-10-CM

## 2013-05-11 DIAGNOSIS — M6281 Muscle weakness (generalized): Secondary | ICD-10-CM

## 2013-05-14 ENCOUNTER — Encounter: Payer: Medicare Other | Admitting: Physical Therapy

## 2013-05-14 DIAGNOSIS — R609 Edema, unspecified: Secondary | ICD-10-CM

## 2013-05-14 DIAGNOSIS — S5290XD Unspecified fracture of unspecified forearm, subsequent encounter for closed fracture with routine healing: Secondary | ICD-10-CM

## 2013-05-14 DIAGNOSIS — M6281 Muscle weakness (generalized): Secondary | ICD-10-CM

## 2013-05-14 DIAGNOSIS — M25539 Pain in unspecified wrist: Secondary | ICD-10-CM

## 2013-05-14 DIAGNOSIS — M2560 Stiffness of unspecified joint, not elsewhere classified: Secondary | ICD-10-CM

## 2013-05-18 ENCOUNTER — Encounter: Payer: Medicare Other | Admitting: Physical Therapy

## 2013-05-18 DIAGNOSIS — S5290XD Unspecified fracture of unspecified forearm, subsequent encounter for closed fracture with routine healing: Secondary | ICD-10-CM

## 2013-05-18 DIAGNOSIS — M6281 Muscle weakness (generalized): Secondary | ICD-10-CM

## 2013-05-18 DIAGNOSIS — M25539 Pain in unspecified wrist: Secondary | ICD-10-CM

## 2013-05-18 DIAGNOSIS — R609 Edema, unspecified: Secondary | ICD-10-CM

## 2013-05-21 ENCOUNTER — Encounter: Payer: Medicare Other | Admitting: Physical Therapy

## 2013-05-21 DIAGNOSIS — R609 Edema, unspecified: Secondary | ICD-10-CM

## 2013-05-21 DIAGNOSIS — M25639 Stiffness of unspecified wrist, not elsewhere classified: Secondary | ICD-10-CM

## 2013-05-21 DIAGNOSIS — M25539 Pain in unspecified wrist: Secondary | ICD-10-CM

## 2013-05-21 DIAGNOSIS — M6281 Muscle weakness (generalized): Secondary | ICD-10-CM

## 2013-05-21 DIAGNOSIS — S5290XD Unspecified fracture of unspecified forearm, subsequent encounter for closed fracture with routine healing: Secondary | ICD-10-CM

## 2013-05-21 DIAGNOSIS — M2560 Stiffness of unspecified joint, not elsewhere classified: Secondary | ICD-10-CM

## 2013-05-25 ENCOUNTER — Encounter: Payer: Medicare Other | Admitting: Physical Therapy

## 2013-05-25 DIAGNOSIS — R609 Edema, unspecified: Secondary | ICD-10-CM

## 2013-05-25 DIAGNOSIS — M6281 Muscle weakness (generalized): Secondary | ICD-10-CM

## 2013-05-25 DIAGNOSIS — S5290XD Unspecified fracture of unspecified forearm, subsequent encounter for closed fracture with routine healing: Secondary | ICD-10-CM

## 2013-05-25 DIAGNOSIS — M25539 Pain in unspecified wrist: Secondary | ICD-10-CM

## 2013-05-25 DIAGNOSIS — M2560 Stiffness of unspecified joint, not elsewhere classified: Secondary | ICD-10-CM

## 2013-06-04 ENCOUNTER — Encounter: Payer: Medicare Other | Admitting: Physical Therapy

## 2013-06-05 ENCOUNTER — Encounter: Payer: Medicare Other | Admitting: Physical Therapy

## 2013-06-05 DIAGNOSIS — S5290XD Unspecified fracture of unspecified forearm, subsequent encounter for closed fracture with routine healing: Secondary | ICD-10-CM

## 2013-06-05 DIAGNOSIS — M25539 Pain in unspecified wrist: Secondary | ICD-10-CM

## 2013-06-05 DIAGNOSIS — R609 Edema, unspecified: Secondary | ICD-10-CM

## 2013-06-05 DIAGNOSIS — M6281 Muscle weakness (generalized): Secondary | ICD-10-CM

## 2013-06-05 DIAGNOSIS — M2560 Stiffness of unspecified joint, not elsewhere classified: Secondary | ICD-10-CM

## 2013-06-08 ENCOUNTER — Encounter: Payer: Medicare Other | Admitting: Physical Therapy

## 2013-06-08 DIAGNOSIS — R609 Edema, unspecified: Secondary | ICD-10-CM

## 2013-06-08 DIAGNOSIS — M6281 Muscle weakness (generalized): Secondary | ICD-10-CM

## 2013-06-08 DIAGNOSIS — S5290XD Unspecified fracture of unspecified forearm, subsequent encounter for closed fracture with routine healing: Secondary | ICD-10-CM

## 2013-06-08 DIAGNOSIS — M2560 Stiffness of unspecified joint, not elsewhere classified: Secondary | ICD-10-CM

## 2013-06-08 DIAGNOSIS — M25539 Pain in unspecified wrist: Secondary | ICD-10-CM

## 2013-06-09 ENCOUNTER — Ambulatory Visit (INDEPENDENT_AMBULATORY_CARE_PROVIDER_SITE_OTHER): Payer: Medicare Other | Admitting: Sports Medicine

## 2013-06-09 ENCOUNTER — Encounter: Payer: Self-pay | Admitting: Sports Medicine

## 2013-06-09 VITALS — BP 135/78 | HR 75 | Wt 119.0 lb

## 2013-06-09 DIAGNOSIS — G5600 Carpal tunnel syndrome, unspecified upper limb: Secondary | ICD-10-CM

## 2013-06-09 DIAGNOSIS — G5601 Carpal tunnel syndrome, right upper limb: Secondary | ICD-10-CM

## 2013-06-09 DIAGNOSIS — S52201D Unspecified fracture of shaft of right ulna, subsequent encounter for closed fracture with routine healing: Secondary | ICD-10-CM

## 2013-06-09 NOTE — Progress Notes (Signed)
  Subjective:    CC: Follow up  HPI: This pleasant 72 year old female is 15 weeks status post fractures of the distal radius and ulnar, she is starting to regain good range of motion and strength but unfortunately she continues to have numbness in the median nerve distribution. This does continue to improve very slowly. She has been working with physical therapy and has learned a great deal of home exercises. At this point she desire to continue conservative measures, with exercises, stretching, and not pursue injection at this time.  Past medical history, Surgical history, Family history not pertinant except as noted below, Social history, Allergies, and medications have been entered into the medical record, reviewed, and no changes needed.   Review of Systems: No fevers, chills, night sweats, weight loss, chest pain, or shortness of breath.   Objective:    General: Well Developed, well nourished, and in no acute distress.  Neuro: Alert and oriented x3, extra-ocular muscles intact, sensation grossly intact.  HEENT: Normocephalic, atraumatic, pupils equal round reactive to light, neck supple, no masses, no lymphadenopathy, thyroid nonpalpable.  Skin: Warm and dry, no rashes. Cardiac: Regular rate and rhythm, no murmurs rubs or gallops, no lower extremity edema.  Respiratory: Clear to auscultation bilaterally. Not using accessory muscles, speaking in full sentences. Right wrist: Approximately 20 of extension and 30 of flexion, pain-free at the fracture site, still has a mildly positive Tinel's sign. She has excellent strength and thumb abduction. Excellent strength to thumb opposition.  Impression and Recommendations:

## 2013-06-09 NOTE — Assessment & Plan Note (Signed)
Is regaining a great deal of range of motion and strength. Return as needed for this, we did discuss continuing on range of motion exercises. I do not think that she will regain full range of motion symmetric with the contralateral side.

## 2013-06-09 NOTE — Assessment & Plan Note (Signed)
This continues to be posttraumatic and improving slightly. She continues to have excellent strength to the thenar imminence muscles, and she does desire to continue conservative measures and she does continue to improve. I would like to see her back in about 3 months and if she continues to have numbness, we can consider a median nerve hydrodissection.

## 2013-09-07 ENCOUNTER — Ambulatory Visit (INDEPENDENT_AMBULATORY_CARE_PROVIDER_SITE_OTHER): Payer: Medicare Other | Admitting: Sports Medicine

## 2013-09-07 ENCOUNTER — Encounter: Payer: Self-pay | Admitting: Sports Medicine

## 2013-09-07 VITALS — BP 130/73 | HR 67 | Ht 64.0 in | Wt 125.0 lb

## 2013-09-07 DIAGNOSIS — G5601 Carpal tunnel syndrome, right upper limb: Secondary | ICD-10-CM

## 2013-09-07 DIAGNOSIS — G5600 Carpal tunnel syndrome, unspecified upper limb: Secondary | ICD-10-CM

## 2013-09-07 DIAGNOSIS — S52201A Unspecified fracture of shaft of right ulna, initial encounter for closed fracture: Secondary | ICD-10-CM

## 2013-09-07 DIAGNOSIS — S5290XA Unspecified fracture of unspecified forearm, initial encounter for closed fracture: Secondary | ICD-10-CM

## 2013-09-07 DIAGNOSIS — S5291XA Unspecified fracture of right forearm, initial encounter for closed fracture: Secondary | ICD-10-CM

## 2013-09-07 NOTE — Assessment & Plan Note (Signed)
Carpal tunnel syndrome was purely posttraumatic after radius and ulna fractures. It has now resolved, return as needed.

## 2013-09-07 NOTE — Progress Notes (Signed)
  Subjective:    CC: Followup  HPI: Post traumatic carpal tunnel syndrome: Has now resolved.  Right distal radius and ulnar fracture: Clinically healed, fracture was approximately 5 months ago. With her formal physical therapy and has good strength, range of motion, and no pain.  Past medical history, Surgical history, Family history not pertinant except as noted below, Social history, Allergies, and medications have been entered into the medical record, reviewed, and no changes needed.   Review of Systems: No fevers, chills, night sweats, weight loss, chest pain, or shortness of breath.   Objective:    General: Well Developed, well nourished, and in no acute distress.  Neuro: Alert and oriented x3, extra-ocular muscles intact, sensation grossly intact.  HEENT: Normocephalic, atraumatic, pupils equal round reactive to light, neck supple, no masses, no lymphadenopathy, thyroid nonpalpable.  Skin: Warm and dry, no rashes. Cardiac: Regular rate and rhythm, no murmurs rubs or gallops, no lower extremity edema.  Respiratory: Clear to auscultation bilaterally. Not using accessory muscles, speaking in full sentences. Right Wrist: Inspection normal with no visible erythema or swelling. ROM smooth and normal with good flexion and extension and ulnar/radial deviation that is symmetrical with opposite wrist. Palpation is normal over metacarpals, navicular, lunate, and TFCC; tendons without tenderness/ swelling No snuffbox tenderness. No tenderness over Canal of Guyon. Strength 5/5 in all directions without pain. Negative Finkelstein, tinel's and phalens. Negative Watson's test.  Impression and Recommendations:

## 2013-09-07 NOTE — Assessment & Plan Note (Signed)
Purely pain-free. Range of motion has improved significantly with physical therapy. Return as needed.

## 2013-10-24 ENCOUNTER — Encounter: Payer: Self-pay | Admitting: Emergency Medicine

## 2013-10-24 ENCOUNTER — Emergency Department (INDEPENDENT_AMBULATORY_CARE_PROVIDER_SITE_OTHER)
Admission: EM | Admit: 2013-10-24 | Discharge: 2013-10-24 | Disposition: A | Discharge status: Home / Self Care | Payer: Medicare Other | Source: Home / Self Care | Attending: Family Medicine | Admitting: Family Medicine

## 2013-10-24 DIAGNOSIS — K122 Cellulitis and abscess of mouth: Secondary | ICD-10-CM

## 2013-10-24 MED ORDER — AMOXICILLIN 875 MG PO TABS: 875.0000 mg | ORAL_TABLET | Freq: Two times a day (BID) | ORAL | Status: DC

## 2013-10-24 NOTE — Discharge Instructions (Signed)
Begin applying warm compress to left cheek 4 to 5 times daily.  Begin warm salt water gargles several times daily.  May take Tylenol for pain.  Recommend a follow-up dental evaluation. If symptoms become significantly worse during the night or over the weekend, proceed to the local emergency room.    Cellulitis Cellulitis is an infection of the skin and the tissue beneath it. The infected area is usually red and tender. Cellulitis occurs most often in the arms and lower legs.  CAUSES  Cellulitis is caused by bacteria that enter the skin through cracks or cuts in the skin. The most common types of bacteria that cause cellulitis are Staphylococcus and Streptococcus. SYMPTOMS   Redness and warmth.  Swelling.  Tenderness or pain.  Fever. DIAGNOSIS  Your caregiver can usually determine what is wrong based on a physical exam. Blood tests may also be done. TREATMENT  Treatment usually involves taking an antibiotic medicine. HOME CARE INSTRUCTIONS   Take your antibiotics as directed. Finish them even if you start to feel better.  Keep the infected arm or leg elevated to reduce swelling.  Apply a warm cloth to the affected area up to 4 times per day to relieve pain.  Only take over-the-counter or prescription medicines for pain, discomfort, or fever as directed by your caregiver.  Keep all follow-up appointments as directed by your caregiver. SEEK MEDICAL CARE IF:   You notice red streaks coming from the infected area.  Your red area gets larger or turns dark in color.  Your bone or joint underneath the infected area becomes painful after the skin has healed.  Your infection returns in the same area or another area.  You notice a swollen bump in the infected area.  You develop new symptoms. SEEK IMMEDIATE MEDICAL CARE IF:   You have a fever.  You feel very sleepy.  You develop vomiting or diarrhea.  You have a general ill feeling (malaise) with muscle aches and  pains. MAKE SURE YOU:   Understand these instructions.  Will watch your condition.  Will get help right away if you are not doing well or get worse. Document Released: 03/28/2005 Document Revised: 12/18/2011 Document Reviewed: 09/03/2011 [facility] Patient Information 2014 [place], [place].

## 2013-10-24 NOTE — ED Provider Notes (Signed)
CSN: [id number]     Arrival date & time 10/24/13  1257 History   First MD Initiated Contact with Patient 10/24/13 1346     Chief Complaint  Patient presents with  . Facial Swelling      HPI Comments: Patient noticed swelling in her left lower jaw area last night, with increased swelling of her left cheek today.  She has had mild soreness.  She has noted mild soreness in a left mandibular tooth.  No fevers, chills, and sweats.  The history is provided by the patient.    Past Medical History  Diagnosis Date  . Hypertension   . Thyroid disease   . Hyperlipidemia   . Diabetes mellitus    History reviewed. No pertinent past surgical history. Family History  Problem Relation Age of Onset  . Heart disease Mother   . Heart disease Father    History  Substance Use Topics  . Smoking status: Never Smoker   . Smokeless tobacco: Never Used  . Alcohol Use: No   OB History   Grav Para Term Preterm Abortions TAB SAB Ect Mult Living                 Review of Systems  Constitutional: Negative for fever, chills and fatigue.  HENT: Positive for facial swelling. Negative for congestion, ear pain, mouth sores, sinus pressure, sore throat and trouble swallowing.   Eyes: Negative.   Respiratory: Negative.   Cardiovascular: Negative.   Gastrointestinal: Negative.   Genitourinary: Negative.   Musculoskeletal: Negative.   Neurological: Negative.   All other systems reviewed and are negative.   Allergies  Lipitor  Home Medications   Prior to Admission medications   Medication Sig Start Date End Date Taking? Authorizing Provider  alendronate (FOSAMAX) 70 MG tablet Take 70 mg by mouth every 7 (seven) days. Take with a full glass of water on an empty stomach.    Historical Provider, MD  brimonidine (ALPHAGAN P) 0.1 % SOLN Place 1 drop into both eyes 2 (two) times daily.    Historical Provider, MD  calcium carbonate (OS-CAL) 600 MG TABS Take 600 mg by mouth daily.    Historical Provider, MD   levothyroxine (SYNTHROID, LEVOTHROID) 50 MCG tablet Take 50 mcg by mouth daily.    Historical Provider, MD  lisinopril (PRINIVIL,ZESTRIL) 20 MG tablet Take 20 mg by mouth daily.    Historical Provider, MD  lovastatin (MEVACOR) 20 MG tablet Take 20 mg by mouth at bedtime.    Historical Provider, MD  metformin (FORTAMET) 500 MG (OSM) 24 hr tablet Take 500 mg by mouth daily with breakfast.    Historical Provider, MD   BP 162/92  Pulse 80  Temp(Src) 98.2 F (36.8 C) (Oral)  Resp 16  Ht 5\' 4"  (1.626 m)  Wt 127 lb (57.607 kg)  BMI 21.79 kg/m2  SpO2 96% Physical Exam  Nursing note and vitals reviewed. Constitutional: She is oriented to person, place, and time. She appears well-developed and well-nourished. No distress.  HENT:  Head: Atraumatic.    Right Ear: External ear normal.  Left Ear: External ear normal.  Nose: Nose normal.  Mouth/Throat: Oropharynx is clear and moist and mucous membranes are normal. No oral lesions. Abnormal dentition.    Left mandibular teeth,  as noted on diagram, have mildly tender and swollen, but not fluctuant, gingiva. Left cheek,  as noted on diagram, is swollen without erythema, warmth, or fluctuance and has mild tenderness to palpation.  Eyes: Conjunctivae and EOM  are normal. Pupils are equal, round, and reactive to light. Right eye exhibits no discharge. Left eye exhibits discharge.  Neck: Neck supple.  Cardiovascular: Normal heart sounds.   Pulmonary/Chest: Breath sounds normal.  Lymphadenopathy:    She has no cervical adenopathy.  Neurological: She is alert and oriented to person, place, and time.  Skin: Skin is warm and dry. No rash noted.    ED Course  Procedures  none          MDM   1. Cellulitis of left internal cheek; suspect left mandibular teeth as source.    Begin amoxicillin.   Begin applying warm compress to left cheek 4 to 5 times daily.  Begin warm salt water gargles several times daily.  May take Tylenol for pain.   Recommend a follow-up dental evaluation. Followup with ENT if not improved 3 days. If symptoms become significantly worse during the night or over the weekend, proceed to the local emergency room    Lattie HawStephen A Kahne Helfand, MD 10/30/13 1108

## 2013-10-24 NOTE — ED Notes (Signed)
Noticed swellin of left lower jaw area last night; now enlarged. States a little sore, but not actually painful. No known injury.

## 2013-11-25 ENCOUNTER — Emergency Department (INDEPENDENT_AMBULATORY_CARE_PROVIDER_SITE_OTHER)
Admission: EM | Admit: 2013-11-25 | Discharge: 2013-11-25 | Disposition: A | Discharge status: Home / Self Care | Payer: Medicare Other | Source: Home / Self Care | Attending: Family Medicine | Admitting: Family Medicine

## 2013-11-25 ENCOUNTER — Encounter: Payer: Self-pay | Admitting: Emergency Medicine

## 2013-11-25 DIAGNOSIS — S61209A Unspecified open wound of unspecified finger without damage to nail, initial encounter: Secondary | ICD-10-CM

## 2013-11-25 DIAGNOSIS — IMO0001 Reserved for inherently not codable concepts without codable children: Secondary | ICD-10-CM

## 2013-11-25 NOTE — ED Notes (Signed)
Pt c/o LT 3rd finger laceration x 1800 today. She reports cutting it with a trimmer at home. She reports that she believes her Tdap was within the past 5 years, she will check with her PCP tomorrow to verify the date.

## 2013-11-25 NOTE — Discharge Instructions (Signed)
Laceration Care, Adult A laceration is a cut or lesion that goes through all layers of the skin and into the tissue just beneath the skin. TREATMENT  Some lacerations may not require closure. Some lacerations may not be able to be closed due to an increased risk of infection. It is important to see your caregiver as soon as possible after an injury to minimize the risk of infection and maximize the opportunity for successful closure. If closure is appropriate, pain medicines may be given, if needed. The wound will be cleaned to help prevent infection. Your caregiver will use stitches (sutures), staples, wound glue (adhesive), or skin adhesive strips to repair the laceration. These tools bring the skin edges together to allow for faster healing and a better cosmetic outcome. However, all wounds will heal with a scar. Once the wound has healed, scarring can be minimized by covering the wound with sunscreen during the day for 1 full year. HOME CARE INSTRUCTIONS   For wound adhesive:  You may briefly wet your wound in the shower or bath. Do not soak or scrub the wound. Do not swim. Avoid periods of heavy perspiration until the skin adhesive has fallen off on its own. After showering or bathing, gently pat the wound dry with a clean towel.  Do not apply liquid medicine, cream medicine, or ointment medicine to your wound while the skin adhesive is in place. This may loosen the film before your wound is healed.  If a dressing is placed over the wound, be careful not to apply tape directly over the skin adhesive. This may cause the adhesive to be pulled off before the wound is healed.  Avoid prolonged exposure to sunlight or tanning lamps while the skin adhesive is in place. Exposure to ultraviolet light in the first year will darken the scar.  The skin adhesive will usually remain in place for 5 to 10 days, then naturally fall off the skin. Do not pick at the adhesive film. You may need a tetanus shot  if:  You cannot remember when you had your last tetanus shot.  You have never had a tetanus shot. If you get a tetanus shot, your arm may swell, get red, and feel warm to the touch. This is common and not a problem. If you need a tetanus shot and you choose not to have one, there is a rare chance of getting tetanus. Sickness from tetanus can be serious. SEEK MEDICAL CARE IF:   You have redness, swelling, or increasing pain in the wound.  You see a red line that goes away from the wound.  You have yellowish-white fluid (pus) coming from the wound.  You have a fever.  You notice a bad smell coming from the wound or dressing.  Your wound breaks open before or after sutures have been removed.  You notice something coming out of the wound such as wood or glass.  Your wound is on your hand or foot and you cannot move a finger or toe. SEEK IMMEDIATE MEDICAL CARE IF:   Your pain is not controlled with prescribed medicine.  You have severe swelling around the wound causing pain and numbness or a change in color in your arm, hand, leg, or foot.  Your wound splits open and starts bleeding.  You have worsening numbness, weakness, or loss of function of any joint around or beyond the wound.  You develop painful lumps near the wound or on the skin anywhere on your body. MAKE SURE  YOU:   Understand these instructions.  Will watch your condition.  Will get help right away if you are not doing well or get worse. Document Released: 06/18/2005 Document Revised: 09/10/2011 Document Reviewed: 12/12/2010 [facility] Patient Information 2014 [place], [place].

## 2013-11-25 NOTE — ED Provider Notes (Signed)
CSN: [id number]     Arrival date & time 11/25/13  1808 History   First MD Initiated Contact with Patient 11/25/13 1852     Chief Complaint  Patient presents with  . Extremity Laceration      HPI Comments: Patient cut her left 3rd fingertip while using an Publishing rights [employer] today.  She received her last Tdap 03/08/2010.  Patient is a 73 y.o. female presenting with skin laceration. The history is provided by the patient.  Laceration Location:  Finger Finger laceration location:  L middle finger Length (cm):  1 Laceration depth: through tip of fingernail. Quality: straight   Bleeding: controlled   Time since incident:  35 minutes Laceration mechanism:  Metal edge Pain details:    Quality:  Aching   Severity:  Mild   Timing:  Constant   Progression:  Unchanged Foreign body present:  No foreign bodies Relieved by:  Pressure Worsened by:  Nothing tried Ineffective treatments:  None tried Tetanus status:  Up to date   Past Medical History  Diagnosis Date  . Hypertension   . Thyroid disease   . Hyperlipidemia   . Diabetes mellitus    History reviewed. No pertinent past surgical history. Family History  Problem Relation Age of Onset  . Heart disease Mother   . Heart disease Father    History  Substance Use Topics  . Smoking status: Never Smoker   . Smokeless tobacco: Never Used  . Alcohol Use: No   OB History   Grav Para Term Preterm Abortions TAB SAB Ect Mult Living                 Review of Systems  All other systems reviewed and are negative.   Allergies  Lipitor  Home Medications   Prior to Admission medications   Medication Sig Start Date End Date Taking? Authorizing Provider  alendronate (FOSAMAX) 70 MG tablet Take 70 mg by mouth every 7 (seven) days. Take with a full glass of water on an empty stomach.    Historical Provider, MD  brimonidine (ALPHAGAN P) 0.1 % SOLN Place 1 drop into both eyes 2 (two) times daily.    Historical Provider, MD  calcium  carbonate (OS-CAL) 600 MG TABS Take 600 mg by mouth daily.    Historical Provider, MD  levothyroxine (SYNTHROID, LEVOTHROID) 50 MCG tablet Take 50 mcg by mouth daily.    Historical Provider, MD  lisinopril (PRINIVIL,ZESTRIL) 20 MG tablet Take 20 mg by mouth daily.    Historical Provider, MD  lovastatin (MEVACOR) 20 MG tablet Take 20 mg by mouth at bedtime.    Historical Provider, MD  metformin (FORTAMET) 500 MG (OSM) 24 hr tablet Take 500 mg by mouth daily with breakfast.    Historical Provider, MD   BP 165/81  Pulse 76  Temp(Src) 98.7 F (37.1 C) (Oral)  Resp 18  SpO2 97% Physical Exam  Nursing note and vitals reviewed. Constitutional: She is oriented to person, place, and time. She appears well-developed and well-nourished. No distress.  HENT:  Head: Atraumatic.  Eyes: Conjunctivae are normal. Pupils are equal, round, and reactive to light.  Musculoskeletal:       Left hand: She exhibits laceration. She exhibits normal range of motion, no tenderness, no bony tenderness, normal two-point discrimination, normal capillary refill, no deformity and no swelling.       Hands: Left third fingertip has a 1cm long linear laceration through tip of fingernail just barely involving nail bed  as  noted on diagram.    Neurological: She is alert and oriented to person, place, and time.  Skin: Skin is warm and dry.    ED Course  Procedures Laceration Repair (Dermabond) Discussed benefits and risks of procedure and verbal consent obtained. Using sterile technique, cleansed wound with Betadine followed by copious lavage with normal saline.  Wound carefully inspected for debris and foreign bodies; none found.  Lacerated fingernail edges carefully approximated in normal anatomic position and closed with Dermabond.  Wound precautions explained to patient.         MDM   1. Laceration of third finger of left hand     Stack splint applied.  Dermabond instructions discussed.  Wound precautions  discussed:  Return for any signs of infection    Lattie HawStephen A Violanda Bobeck, MD 11/25/13 2211

## 2014-01-13 ENCOUNTER — Emergency Department (INDEPENDENT_AMBULATORY_CARE_PROVIDER_SITE_OTHER)
Admission: EM | Admit: 2014-01-13 | Discharge: 2014-01-13 | Disposition: A | Discharge status: Home / Self Care | Payer: Medicare Other | Source: Home / Self Care | Attending: Family Medicine | Admitting: Family Medicine

## 2014-01-13 ENCOUNTER — Encounter: Payer: Self-pay | Admitting: Emergency Medicine

## 2014-01-13 DIAGNOSIS — J069 Acute upper respiratory infection, unspecified: Secondary | ICD-10-CM

## 2014-01-13 MED ORDER — AMOXICILLIN 875 MG PO TABS: 875.0000 mg | ORAL_TABLET | Freq: Two times a day (BID) | ORAL | Status: DC

## 2014-01-13 MED ORDER — BENZONATATE 200 MG PO CAPS: 200.0000 mg | ORAL_CAPSULE | Freq: Every day | ORAL | Status: DC

## 2014-01-13 NOTE — Discharge Instructions (Signed)
For itching on back, may apply 1% hydrocortisone cream 2 or 3 times daily Take plain Mucinex (1200 mg guaifenesin) twice daily for cough and congestion. Increase fluid intake, rest.  Also recommend using saline nasal spray several times daily and saline nasal irrigation (AYR is a common brand) Try warm salt water gargles for sore throat.  Stop all antihistamines for now, and other non-prescription cough/cold preparations. Begin Amoxicillin if not improving about 5 days or if persistent fever develops   Follow-up with family doctor if not improving 7 to 10 days.

## 2014-01-13 NOTE — ED Notes (Signed)
Sneezing, sore throat, hoarse, eyes red, chills x 3-4 days

## 2014-01-13 NOTE — ED Provider Notes (Signed)
CSN: [id number]     Arrival date & time 01/13/14  1350 History   First MD Initiated Contact with Patient 01/13/14 1418     Chief Complaint  Patient presents with  . Allergies      HPI Comments: Patient complains of 3 to 4 day history of hoarseness, sore throat, fatigue, nasal congestion, and occasional cough. She has had eye redness.  She complains of itching on her back without rash.  The history is provided by the patient.    Past Medical History  Diagnosis Date  . Hypertension   . Thyroid disease   . Hyperlipidemia   . Diabetes mellitus    History reviewed. No pertinent past surgical history. Family History  Problem Relation Age of Onset  . Heart disease Mother   . Heart disease Father    History  Substance Use Topics  . Smoking status: Never Smoker   . Smokeless tobacco: Never Used  . Alcohol Use: No   OB History   Grav Para Term Preterm Abortions TAB SAB Ect Mult Living                 Review of Systems + sore throat + hoarse + occasional cough No pleuritic pain No wheezing + nasal congestion + post-nasal drainage No sinus pain/pressure + itchy/red eyes No earache No hemoptysis No SOB No fever/chills No nausea No vomiting No abdominal pain + diarrhea/constipation No urinary symptoms No skin rash but has itching on back + fatigue No myalgias No  headache Used OTC meds without relief  Allergies  Lipitor  Home Medications   Prior to Admission medications   Medication Sig Start Date End Date Taking? Authorizing Provider  alendronate (FOSAMAX) 70 MG tablet Take 70 mg by mouth every 7 (seven) days. Take with a full glass of water on an empty stomach.    Historical Provider, MD  amoxicillin (AMOXIL) 875 MG tablet Take 1 tablet (875 mg total) by mouth 2 (two) times daily. (Rx void after 01/21/14) 01/13/14   Lattie HawStephen A Beese, MD  benzonatate (TESSALON) 200 MG capsule Take 1 capsule (200 mg total) by mouth at bedtime. Take as needed for cough 01/13/14    Lattie HawStephen A Beese, MD  brimonidine (ALPHAGAN P) 0.1 % SOLN Place 1 drop into both eyes 2 (two) times daily.    Historical Provider, MD  calcium carbonate (OS-CAL) 600 MG TABS Take 600 mg by mouth daily.    Historical Provider, MD  levothyroxine (SYNTHROID, LEVOTHROID) 50 MCG tablet Take 50 mcg by mouth daily.    Historical Provider, MD  lisinopril (PRINIVIL,ZESTRIL) 20 MG tablet Take 20 mg by mouth daily.    Historical Provider, MD  lovastatin (MEVACOR) 20 MG tablet Take 20 mg by mouth at bedtime.    Historical Provider, MD  metformin (FORTAMET) 500 MG (OSM) 24 hr tablet Take 500 mg by mouth daily with breakfast.    Historical Provider, MD   BP 137/71  Pulse 90  Temp(Src) 98.8 F (37.1 C) (Oral)  Ht 5\' 4"  (1.626 m)  Wt 127 lb (57.607 kg)  BMI 21.79 kg/m2  SpO2 100% Physical Exam Nursing notes and Vital Signs reviewed. Appearance:  Patient appears healthy, stated age, and in no acute distress Eyes:  Pupils are equal, round, and reactive to light and accomodation.  Extraocular movement is intact.  Conjunctivae are minimally injected.  No discharge or photophobia Ears:  Canals normal.  Tympanic membranes normal.  Nose:  Mildly congested turbinates.  No sinus tenderness.  Pharynx:  Normal Neck:  Supple.   Tender shotty posterior nodes are palpated bilaterally  Lungs:  Clear to auscultation.  Breath sounds are equal.  Heart:  Regular rate and rhythm without murmurs, rubs, or gallops.  Abdomen:  Nontender without masses or hepatosplenomegaly.  Bowel sounds are present.  No CVA or flank tenderness.  Extremities:  No edema.  No calf tenderness Skin:  No rash present.   ED Course  Procedures  none          MDM   1. Viral URI    There is no evidence of bacterial infection today. Treat symptomatically for now  Prescription written for Benzonatate (Tessalon) to take at bedtime for night-time cough.  For itching on back, may apply 1% hydrocortisone cream 2 or 3 times daily Take plain  Mucinex (1200 mg guaifenesin) twice daily for cough and congestion. Increase fluid intake, rest.  Also recommend using saline nasal spray several times daily and saline nasal irrigation (AYR is a common brand) Try warm salt water gargles for sore throat.  Stop all antihistamines for now, and other non-prescription cough/cold preparations. Begin Amoxicillin if not improving about 5 days or if persistent fever develops   Follow-up with family doctor if not improving 7 to 10 days.     Lattie Haw, MD 01/15/14 [id number]

## 2014-07-07 ENCOUNTER — Other Ambulatory Visit: Payer: Self-pay | Admitting: Family Medicine

## 2014-07-07 DIAGNOSIS — Z1231 Encounter for screening mammogram for malignant neoplasm of breast: Secondary | ICD-10-CM

## 2014-07-07 DIAGNOSIS — Z1239 Encounter for other screening for malignant neoplasm of breast: Secondary | ICD-10-CM

## 2014-07-15 ENCOUNTER — Other Ambulatory Visit: Payer: Self-pay | Admitting: Internal Medicine

## 2014-07-15 ENCOUNTER — Ambulatory Visit (INDEPENDENT_AMBULATORY_CARE_PROVIDER_SITE_OTHER): Payer: Medicare Other

## 2014-07-15 DIAGNOSIS — Z1231 Encounter for screening mammogram for malignant neoplasm of breast: Secondary | ICD-10-CM

## 2014-09-21 IMAGING — CR DG WRIST COMPLETE 3+V*R*
2 series · 2 of 2 positions shown · non-contrast
Comparison: None.

CLINICAL DATA: Tenderness post fall.

RIGHT WRIST - COMPLETE 3+ VIEW

[view not recorded (1 of 2)]
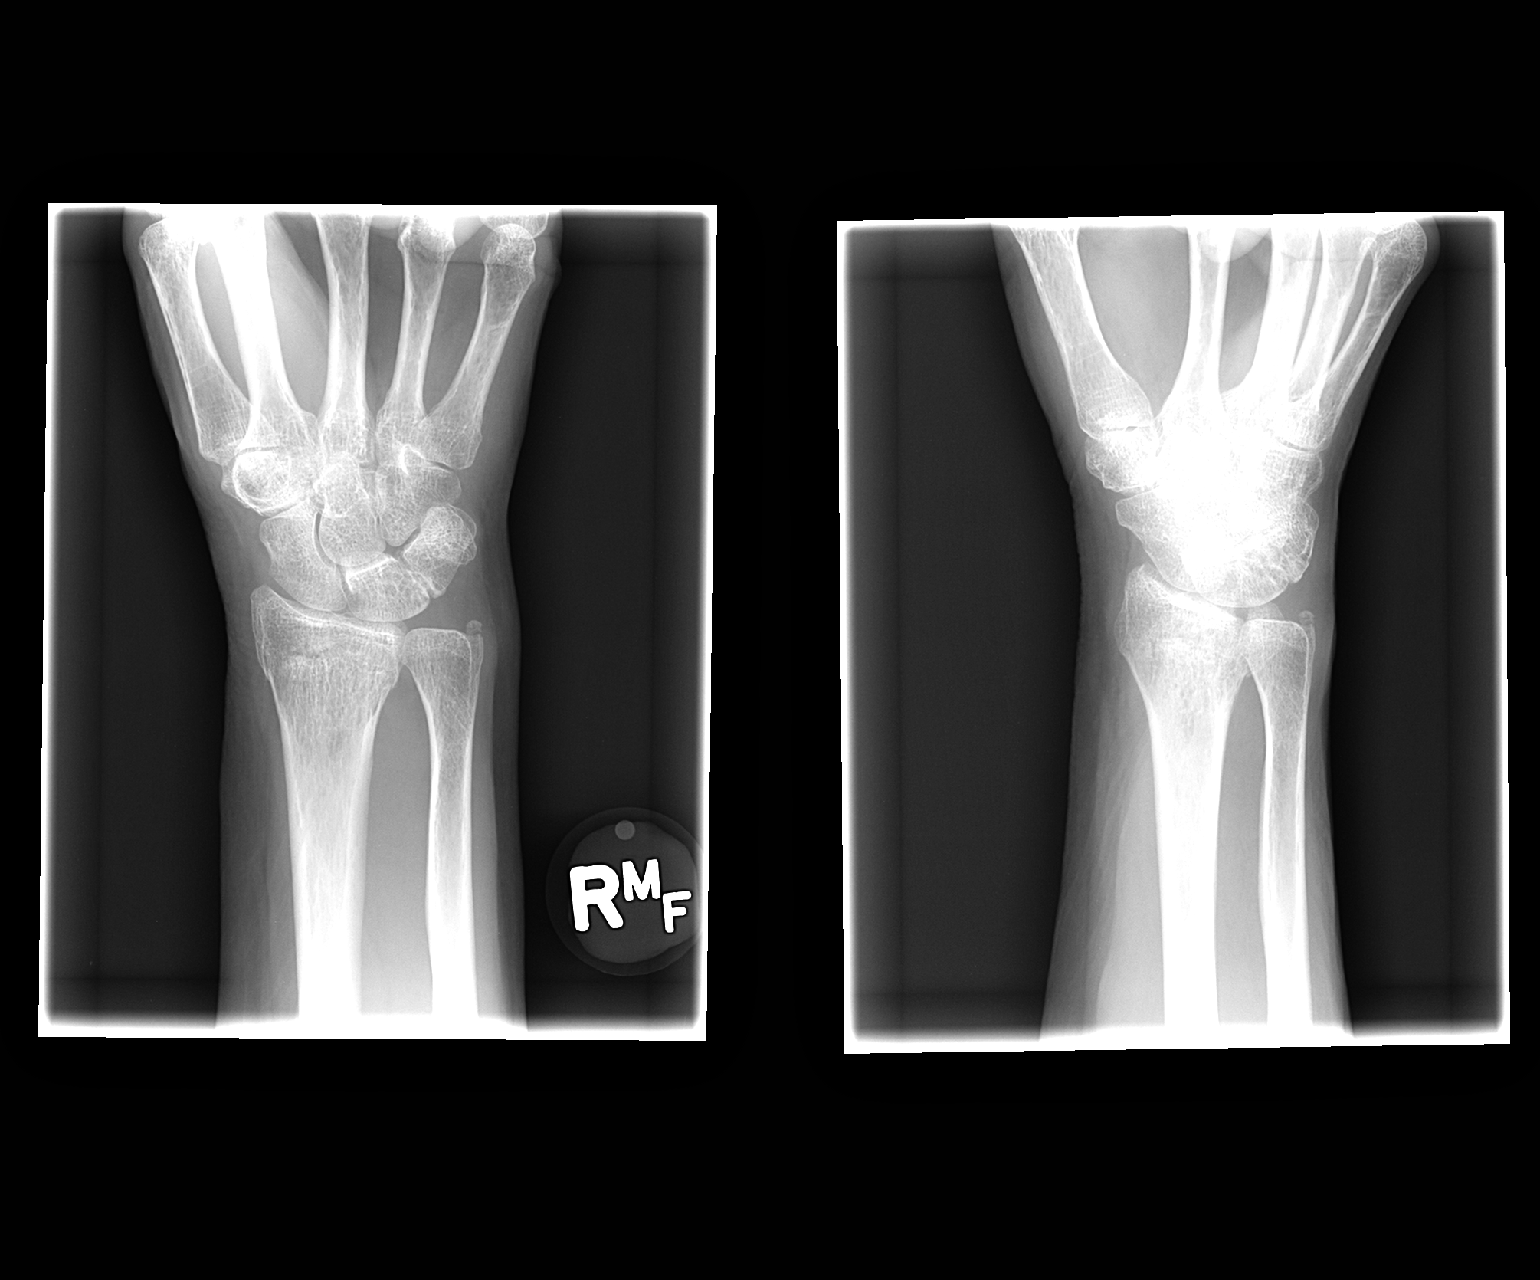

[view not recorded (2 of 2)]
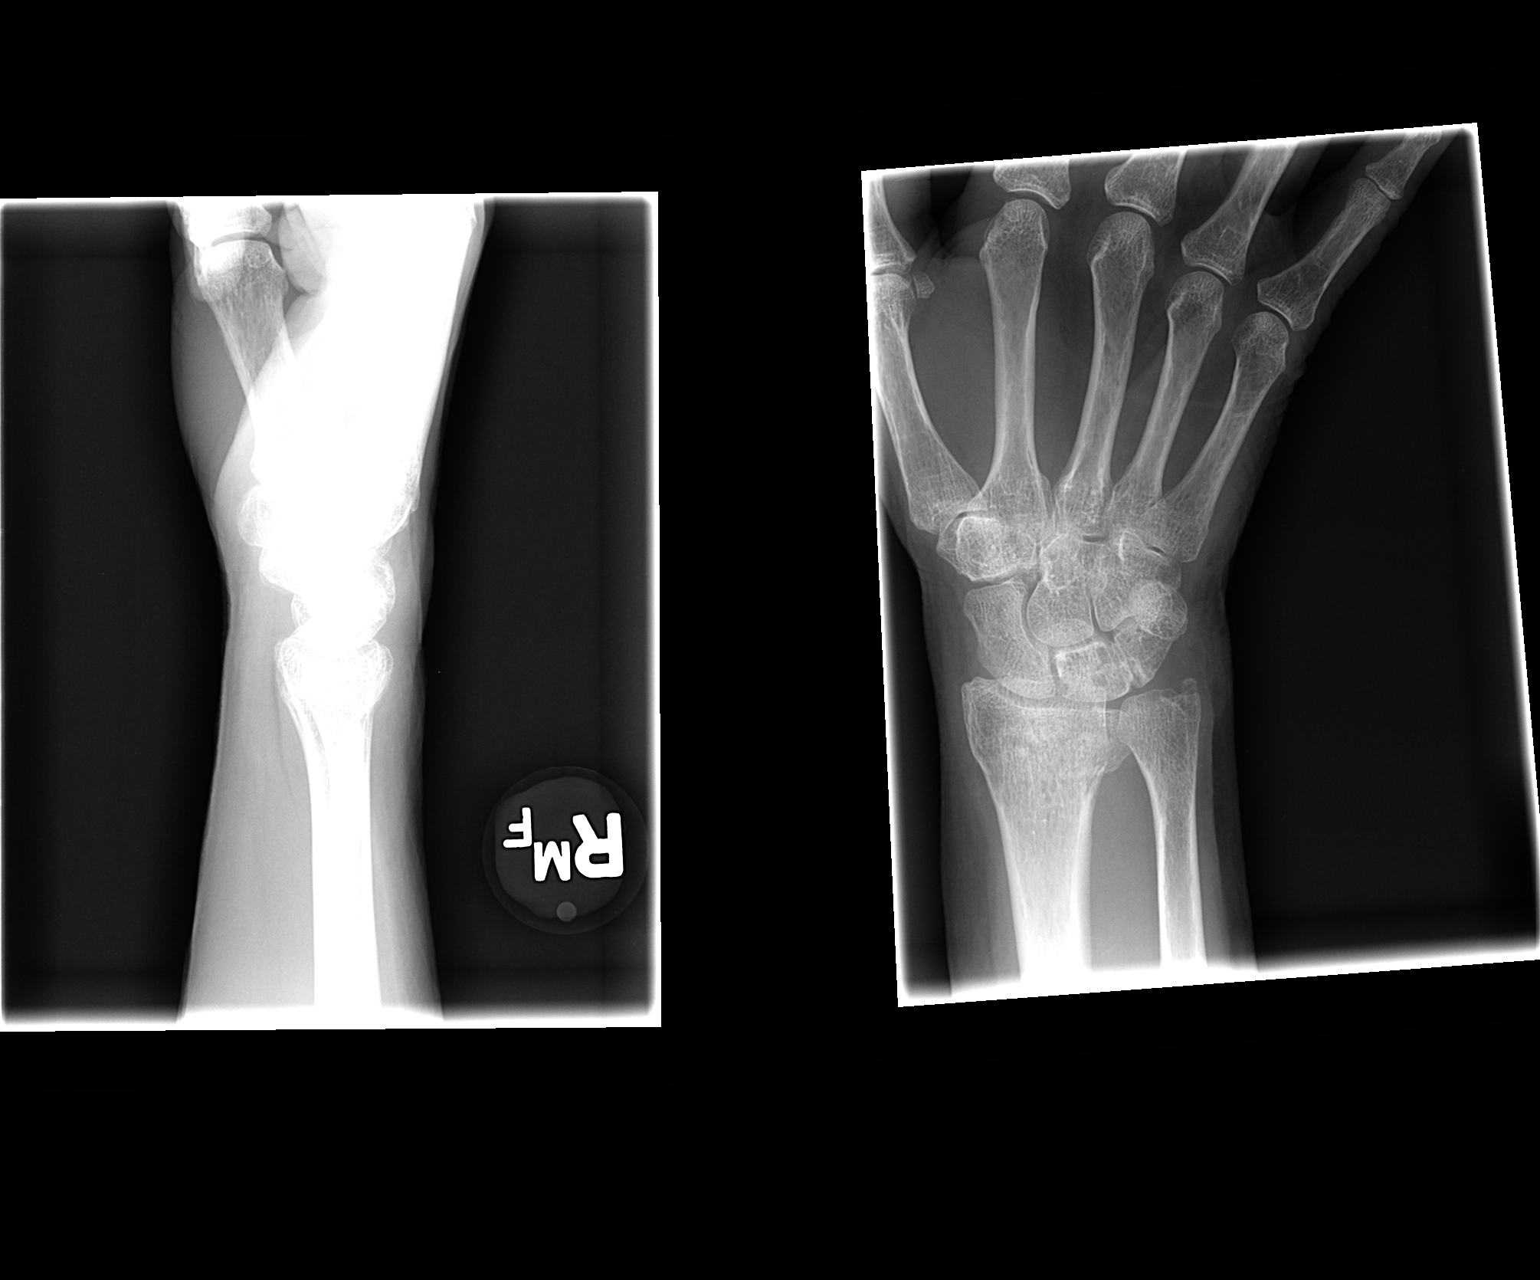

[2 of 2 positions shown; findings below may reference images not displayed]

FINDINGS: Transverse fracture of the distal radial metaphysis,
impacted with mild dorsal angulation of the distal radial articular
surface.  No dislocation.  Carpal rows intact.  There is an ulnar
styloid avulsion fracture, minimally distracted.  Mild diffuse
osteopenia.
IMPRESSION: 1.  Impacted transverse distal radial fracture with dorsal
angulation of the distal articular surface.
2.  Minimally distracted ulnar styloid avulsion fracture.

## 2014-11-12 IMAGING — CR DG WRIST COMPLETE 3+V*R*
4 series · 4 of 4 positions shown · non-contrast
Comparison: 03/03/2013, 02/07/2013.

CLINICAL DATA: Subsequent evaluation of fractures of the distal
radius and the ulnar styloid.

EXAM:
RIGHT WRIST - COMPLETE 3+ VIEW

[view not recorded (1 of 4)]
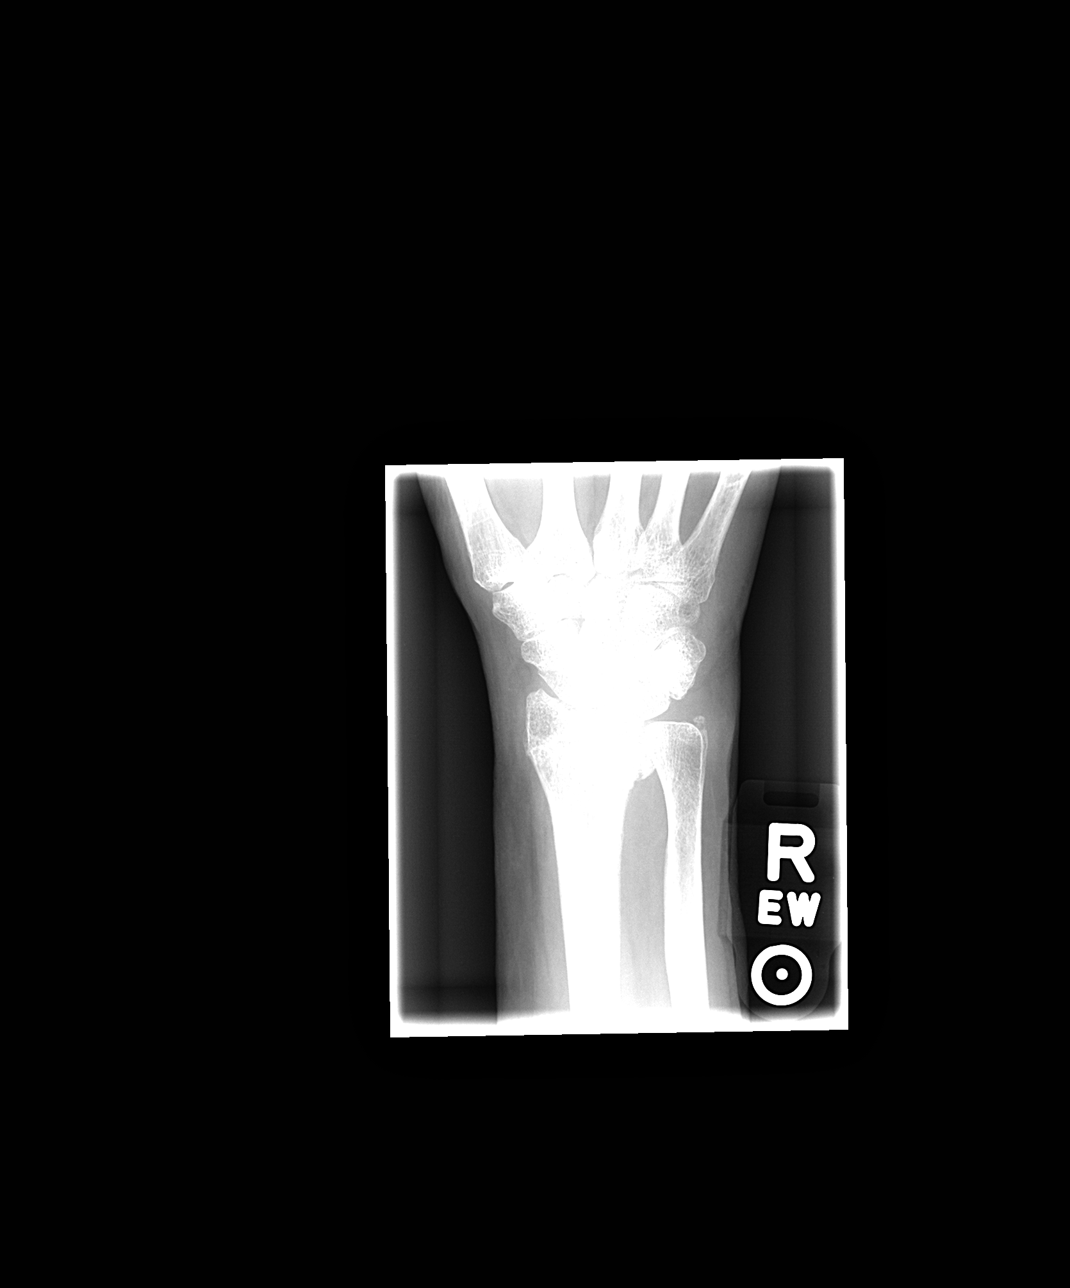

[view not recorded (2 of 4)]
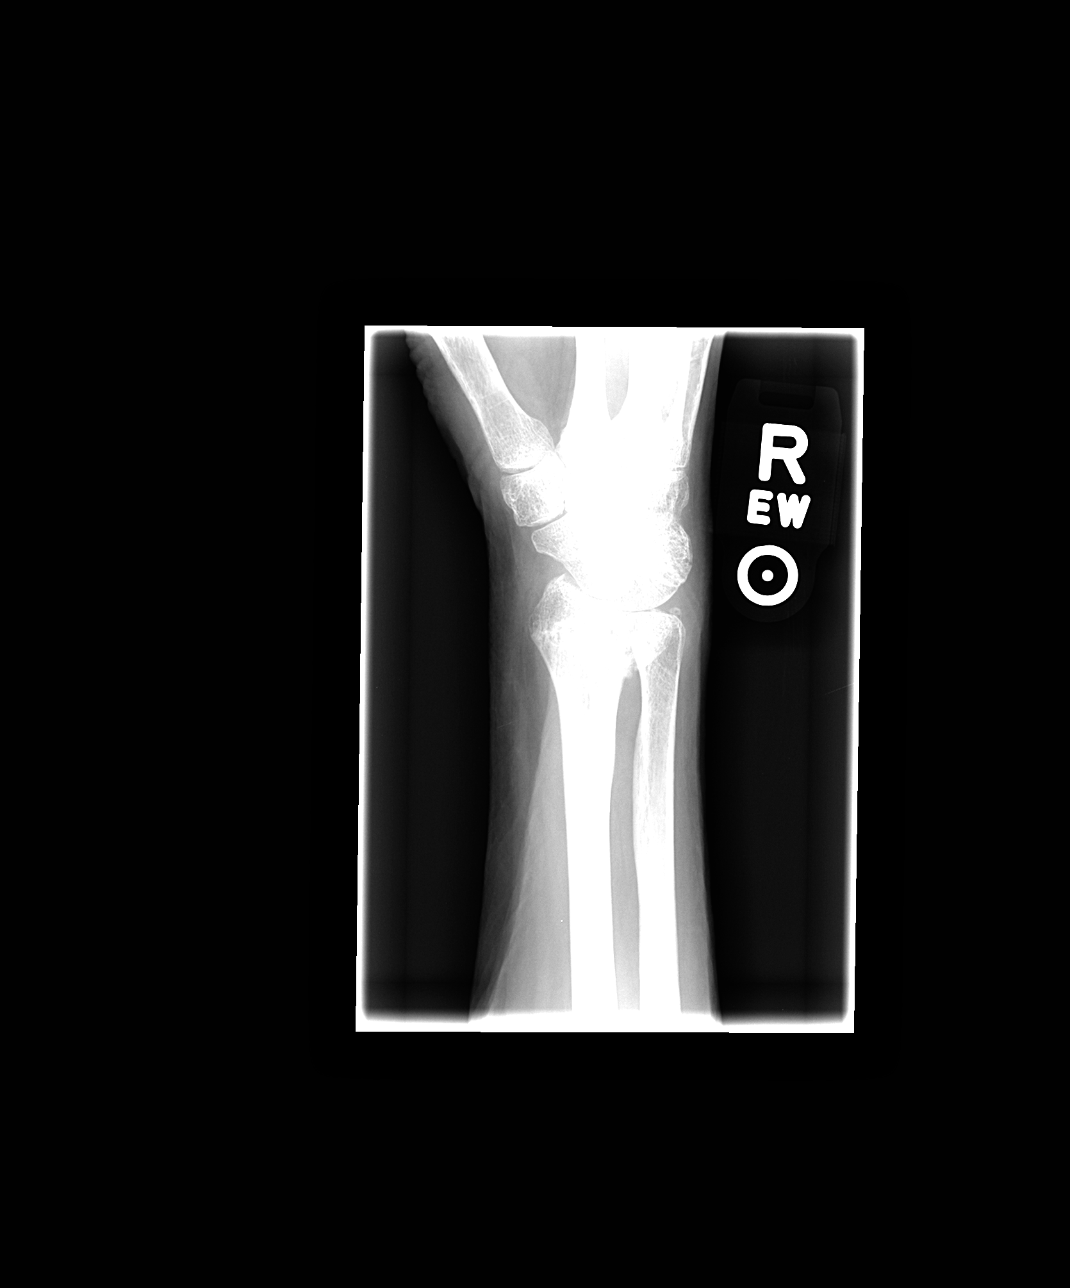

[view not recorded (3 of 4)]
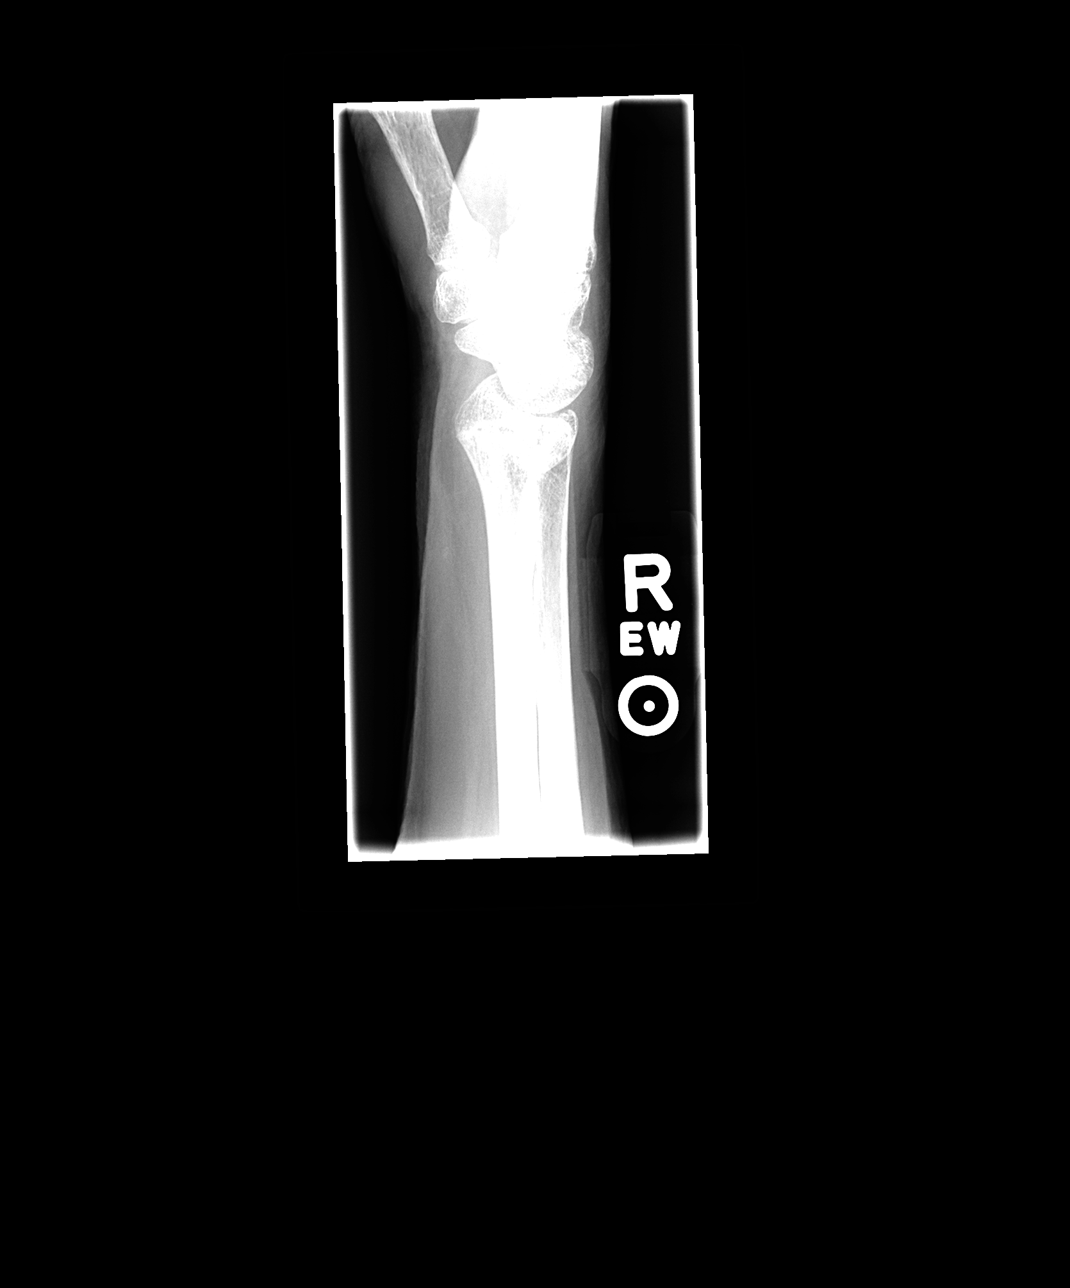

[view not recorded (4 of 4)]
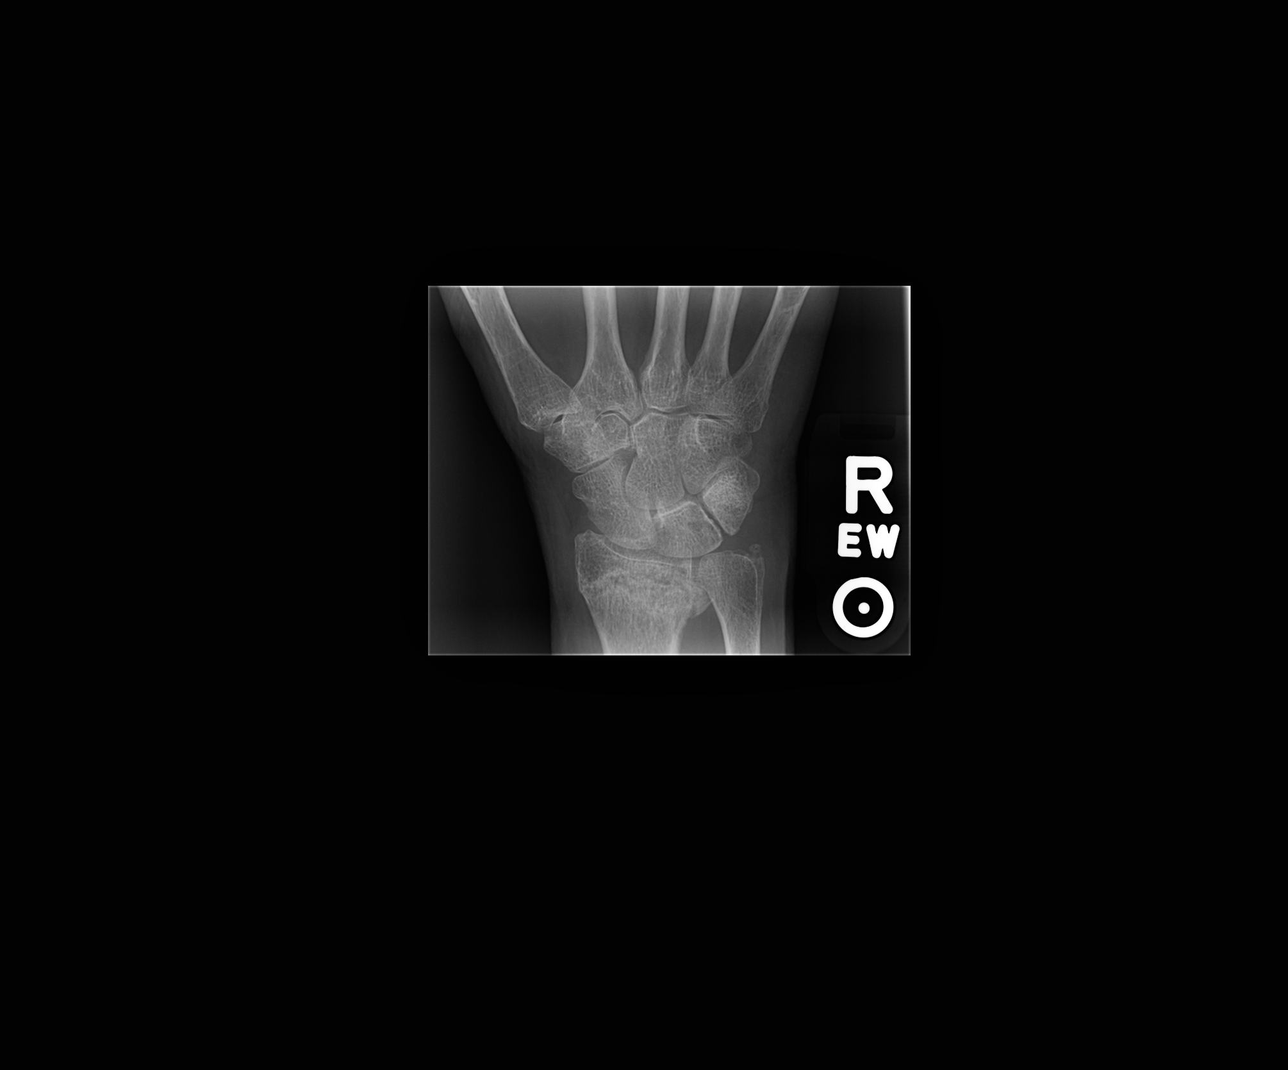

[4 of 4 positions shown; findings below may reference images not displayed]

FINDINGS: Impacted fracture involving the distal radius, with fracture
involving the medial (ulnar) aspect of the distal radius with an
intra-articular component, present as a free fragment. Expected
osseous resorption around the fracture line. Very little if any
periosteal new bone formation. Ulnar styloid fracture again noted.
Currently, dorsal angulation of the distal radial fragment, not
present on the prior examinations. Further dorsal tilt of the lunate
bone relative to the capitate. No carpal bone fractures. Mild
osseous demineralization.
IMPRESSION: 1. No radiographic evidence of fracture healing, as there is little
if any periosteal new bone.
2. Dorsal angulation of the distal radial fracture fragment, not
visualized on the prior examinations
3. Free fragment arising from the medial (ulnar) aspect of the
distal radius which is mildly displaced and extends to the articular
surface.
4. Further dorsal tilt of the lunate relative to the capitate.

## 2015-06-02 ENCOUNTER — Other Ambulatory Visit: Payer: Self-pay | Admitting: Internal Medicine

## 2015-06-02 DIAGNOSIS — Z1231 Encounter for screening mammogram for malignant neoplasm of breast: Secondary | ICD-10-CM

## 2015-07-18 ENCOUNTER — Encounter: Payer: Self-pay | Admitting: Emergency Medicine

## 2015-07-18 ENCOUNTER — Emergency Department (INDEPENDENT_AMBULATORY_CARE_PROVIDER_SITE_OTHER)
Admission: EM | Admit: 2015-07-18 | Discharge: 2015-07-18 | Disposition: A | Discharge status: Home / Self Care | Payer: Medicare Other | Source: Home / Self Care | Attending: Emergency Medicine | Admitting: Emergency Medicine

## 2015-07-18 DIAGNOSIS — H6983 Other specified disorders of Eustachian tube, bilateral: Secondary | ICD-10-CM

## 2015-07-18 DIAGNOSIS — H6503 Acute serous otitis media, bilateral: Secondary | ICD-10-CM | POA: Diagnosis not present

## 2015-07-18 DIAGNOSIS — J309 Allergic rhinitis, unspecified: Secondary | ICD-10-CM | POA: Diagnosis not present

## 2015-07-18 HISTORY — DX: Unspecified hearing loss, unspecified ear: H91.90

## 2015-07-18 HISTORY — DX: Reserved for inherently not codable concepts without codable children: IMO0001

## 2015-07-18 NOTE — ED Provider Notes (Signed)
CSN: [id number]     Arrival date & time 07/18/15  1744 History   First MD Initiated Contact with Patient 07/18/15 1756     Chief Complaint  Patient presents with  . Ear Fullness   (Consider location/radiation/quality/duration/timing/severity/associated sxs/prior Treatment) HPI  Maria Gilbert is a 75 y.o. female who complains of bilateral sensation of ear fullness for 2 days.   Have been using over-the-counter Zyrtec which helped only a little bit.  No chills/sweats No  Fever  + Mild postnasal drainage  Mild clearish nasal discharge  No sinus pain/pressure No sore throat Positive sneezing, she feels like this could be sinus allergies No cough No wheezing No chest congestion No hemoptysis No shortness of breath No pleuritic pain  No itchy/red eyes No earache, but both ears feel full. Mildly decreased hearing bilaterally  No nausea No vomiting No abdominal pain No diarrhea  No skin rashes Denies  Fatigue No myalgias No headache   Past Medical History  Diagnosis Date  . Hypertension   . Thyroid disease   . Hyperlipidemia   . Diabetes mellitus   . Hearing impairment    History reviewed. No pertinent past surgical history. Family History  Problem Relation Age of Onset  . Heart disease Mother   . Heart disease Father    Social History  Substance Use Topics  . Smoking status: Never Smoker   . Smokeless tobacco: Never Used  . Alcohol Use: No   OB History    No data available     Review of Systems  All other systems reviewed and are negative.   Allergies  Lipitor  Home Medications   Prior to Admission medications   Medication Sig Start Date End Date Taking? Authorizing Provider  alendronate (FOSAMAX) 70 MG tablet Take 70 mg by mouth every 7 (seven) days. Take with a full glass of water on an empty stomach.    Historical Provider, MD  brimonidine (ALPHAGAN P) 0.1 % SOLN Place 1 drop into both eyes 2 (two) times daily.    Historical Provider, MD  calcium  carbonate (OS-CAL) 600 MG TABS Take 600 mg by mouth daily.    Historical Provider, MD  levothyroxine (SYNTHROID, LEVOTHROID) 50 MCG tablet Take 50 mcg by mouth daily.    Historical Provider, MD  lisinopril (PRINIVIL,ZESTRIL) 20 MG tablet Take 20 mg by mouth daily.    Historical Provider, MD  lovastatin (MEVACOR) 20 MG tablet Take 20 mg by mouth at bedtime.    Historical Provider, MD  metformin (FORTAMET) 500 MG (OSM) 24 hr tablet Take 500 mg by mouth daily with breakfast.    Historical Provider, MD   Meds Ordered and Administered this Visit  Medications - No data to display  BP 138/78 mmHg  Pulse 73  Temp(Src) 97.6 F (36.4 C) (Oral)  Resp 16  Ht 5\' 4"  (1.626 m)  Wt 125 lb (56.7 kg)  BMI 21.45 kg/m2  SpO2 97% No data found.   Physical Exam  Constitutional: She is oriented to person, place, and time. She appears well-developed and well-nourished. No distress.  HENT:  Head: Normocephalic and atraumatic.  Right Ear: External ear normal.  Left Ear: External ear normal.  Mouth/Throat: Oropharynx is clear and moist.  Both TMs with mild air-fluid levels but no redness or deformity. Both ear canals are clear. No obstruction with wax. Nose: Boggy turbinates, congested. Mild serous drainage. Pharynx: Clear. no redness or exudate  Eyes: Conjunctivae and EOM are normal. Pupils are equal, round, and reactive to  light. No scleral icterus.  Neck: Normal range of motion. Neck supple. No JVD present.  Cardiovascular: Normal rate.   Pulmonary/Chest: Effort normal and breath sounds normal.  Abdominal: She exhibits no distension. There is no tenderness.  Musculoskeletal: Normal range of motion.  Lymphadenopathy:    She has no cervical adenopathy.  Neurological: She is alert and oriented to person, place, and time. No cranial nerve deficit.  Skin: Skin is warm. No rash noted.  Psychiatric: She has a normal mood and affect.  Nursing note and vitals reviewed.   ED Course  Procedures  (including critical care time)  Labs Review Labs Reviewed - No data to display  Imaging Review No results found.   Visual Acuity Review  Right Eye Distance:   Left Eye Distance:   Bilateral Distance:    Right Eye Near:   Left Eye Near:    Bilateral Near:         MDM   1. Eustachian tube dysfunction, bilateral   2. Acute serous otitis media of both ears without rupture   3. Allergic rhinitis, unspecified allergic rhinitis type    I explained to patient that there is no evidence of infection. Treatment options discussed, as well as risks, benefits, alternatives. She declined any prescriptions. Patient voiced understanding and agreement with the following plans: OTC Claritin, 1 daily. OTC Flonase, 2 sprays each nostril twice a day for 1 week, then once a day thereafter as needed. Written instructions given. Follow-up with your primary care doctor or ENT in 5-7 days if not improving, or sooner if symptoms become worse. Precautions discussed. Red flags discussed. Questions invited and answered. Patient voiced understanding and agreement.      Lajean Manesavid Massey, MD 07/18/15 2118

## 2015-07-18 NOTE — ED Notes (Signed)
Reports sensation of ear fullness bilaterally since yesterday.

## 2015-07-20 ENCOUNTER — Ambulatory Visit (INDEPENDENT_AMBULATORY_CARE_PROVIDER_SITE_OTHER): Payer: Medicare Other

## 2015-07-20 DIAGNOSIS — Z1231 Encounter for screening mammogram for malignant neoplasm of breast: Secondary | ICD-10-CM

## 2016-07-13 ENCOUNTER — Other Ambulatory Visit: Payer: Self-pay | Admitting: Internal Medicine

## 2016-07-13 DIAGNOSIS — Z1231 Encounter for screening mammogram for malignant neoplasm of breast: Secondary | ICD-10-CM

## 2016-07-25 ENCOUNTER — Ambulatory Visit (INDEPENDENT_AMBULATORY_CARE_PROVIDER_SITE_OTHER): Payer: Medicare Other

## 2016-07-25 DIAGNOSIS — Z1231 Encounter for screening mammogram for malignant neoplasm of breast: Secondary | ICD-10-CM

## 2016-07-25 DIAGNOSIS — R928 Other abnormal and inconclusive findings on diagnostic imaging of breast: Secondary | ICD-10-CM | POA: Diagnosis not present

## 2016-07-27 ENCOUNTER — Other Ambulatory Visit: Payer: Self-pay | Admitting: Internal Medicine

## 2016-07-27 DIAGNOSIS — R928 Other abnormal and inconclusive findings on diagnostic imaging of breast: Secondary | ICD-10-CM

## 2016-08-07 ENCOUNTER — Other Ambulatory Visit: Payer: Medicare Other

## 2017-05-06 ENCOUNTER — Ambulatory Visit: Payer: Self-pay | Admitting: *Deleted

## 2017-05-06 ENCOUNTER — Telehealth: Payer: Self-pay | Admitting: Internal Medicine

## 2017-05-06 NOTE — Telephone Encounter (Signed)
Copied from CRM [phone]. Topic: Quick Communication - See Telephone Encounter >> May 06, 2017 12:22 PM Rudi CocoLathan, Demarques Pilz M, [place] wrote: CRM for notification. See Telephone encounter for:   05/06/17. Pt complaining about left hip pain said it feels like a blood clot wants to speak with a nurse

## 2017-05-06 NOTE — Telephone Encounter (Deleted)
Pain in left leg has been going on for several weeks.   Feeling numbness and tingling in toes left side that is new.   The pain is going down from my hip and down the back of my left leg.  It's really bad when I got up this morning.  [facility]   Pt agreeable with this plan and appt.  Reason for Disposition . [1] Pain radiates into the thigh or further down the leg AND [2] one leg  Answer Assessment - Initial Assessment Questions 1. LOCATION and RADIATION: "Where is the pain located?"      Left hip running down back of leg. 2. QUALITY: "What does the pain feel like?"  (e.g., sharp, dull, aching, burning)     Aching pain.  Move hip it will shoot down my leg. 3. SEVERITY: "How bad is the pain?" "What does it keep you from doing?"   (Scale 1-10; or mild, moderate, severe)   -  MILD (1-3): doesn't interfere with normal activities    -  MODERATE (4-7): interferes with normal activities (e.g., work or school) or awakens from sleep, limping    -  SEVERE (8-10): excruciating pain, unable to do any normal activities, unable to walk     Moderate 4. ONSET: "When did the pain start?" "Does it come and go, or is it there all the time?"     This morning when I got out of bed. 5. WORK OR EXERCISE: "Has there been any recent work or exercise that involved this part of the body?"      No falls 6. CAUSE: "What do you think is causing the hip pain?"      Blood clot maybe.   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    7. AGGRAVATING FACTORS: "What makes the hip pain worse?" (e.g., walking, climbing stairs, running)     Movement.  Use rolling walker that is normal for me. 8. OTHER SYMPTOMS: "Do you have any other symptoms?" (e.g., back pain, pain shooting down leg,  fever, rash)     Pain shooting down leg.  Lower back pain that is chronic.  Protocols used: BACK PAIN-A-AH, HIP PAIN-A-AH

## 2017-05-07 ENCOUNTER — Ambulatory Visit: Payer: Medicare Other | Admitting: Family Medicine

## 2017-05-07 NOTE — Addendum Note (Signed)
Addended by: Lannie FieldsGIBBS, Loris Winrow J on: 05/07/2017 08:15 AM   Modules accepted: Level of Service, SmartSet

## 2017-05-07 NOTE — Telephone Encounter (Signed)
This encounter was created in error - please disregard.

## 2019-08-08 ENCOUNTER — Other Ambulatory Visit: Payer: Self-pay

## 2019-08-08 ENCOUNTER — Emergency Department (INDEPENDENT_AMBULATORY_CARE_PROVIDER_SITE_OTHER)
Admission: EM | Admit: 2019-08-08 | Discharge: 2019-08-08 | Disposition: A | Discharge status: Home / Self Care | Payer: Medicare Other | Source: Home / Self Care

## 2019-08-08 ENCOUNTER — Encounter: Payer: Self-pay | Admitting: Family Medicine

## 2019-08-08 DIAGNOSIS — H6121 Impacted cerumen, right ear: Secondary | ICD-10-CM

## 2019-08-08 DIAGNOSIS — L089 Local infection of the skin and subcutaneous tissue, unspecified: Secondary | ICD-10-CM

## 2019-08-08 MED ORDER — MUPIROCIN 2 % EX OINT: 1.0000 "application " | TOPICAL_OINTMENT | Freq: Three times a day (TID) | CUTANEOUS | 1 refills | Status: AC

## 2019-08-08 NOTE — ED Provider Notes (Signed)
[facility]    CSN: [id number] Arrival date & time: 08/08/19  1449      History   Chief Complaint Chief Complaint  Patient presents with  . Cerumen Impaction    HPI Maria Gilbert is a 79 y.o. female.   This is the first [facility] urgent care visit for this 79 year old woman in over 3 years.  She is complaining of hearing loss and discomfort in right ear.  She also has developed a pustule on the left cheek over past two weeks.  She has a h/o cerumen impaction.  The pustule is tender.   Note from January 21: Patient presents for about a one week history of wheezing, sneezing, and some coughing. She denies heartburn symptoms. Her biggest complaint is the wheezing. She denies shortness of breath, chest pains, headache, post nasal drip, sore throat, or loss of taste or smell. Denies GI complaints, no nausea, vomiting, or diarrhea. She has been on lisinopril for years.   She was in the sick clinic on 06/23/19 for similar symptoms and diagnosed with bronchitis, given 5 days of prednisone 40mg  and doxycycline 100mg  twice per day x 10 days. She thinks the issue resolved at that time, and is now back. She has been using OTC cough syrup (Robitussin) with mild relief.   No other history of any lung disorders. She takes flonase spray as needed.   Past Medical History:  Diagnosis Date  . Diabetes mellitus   . Hearing impairment   . Hyperlipidemia   . Hypertension   . Thyroid disease     Patient Active Problem List   Diagnosis Date Noted  . Carpal tunnel syndrome of right wrist 04/28/2013  . Closed fracture of right radius and ulna 02/09/2013  . URI 02/10/2011  . CONTACT DERMATITIS 12/12/2010  . DIABETES MELLITUS, TYPE II 03/08/2010  . HYPERLIPIDEMIA 03/08/2010  . HYPERTENSION 03/08/2010  . CELLULITIS, ARM 03/08/2010  . BEE STING REACTION, LOCAL 03/08/2010    History reviewed. No pertinent surgical history.  OB History   No obstetric history on file.       Home Medications    Prior to Admission medications   Medication Sig Start Date End Date Taking? Authorizing Provider  alendronate (FOSAMAX) 70 MG tablet Take 70 mg by mouth every 7 (seven) days. Take with a full glass of water on an empty stomach.    [provider]  brimonidine (ALPHAGAN P) 0.1 % SOLN Place 1 drop into both eyes 2 (two) times daily.    [provider]  calcium carbonate (OS-CAL) 600 MG TABS Take 600 mg by mouth daily.    [provider]  levothyroxine (SYNTHROID, LEVOTHROID) 50 MCG tablet Take 50 mcg by mouth daily.    [provider]  lisinopril (PRINIVIL,ZESTRIL) 20 MG tablet Take 20 mg by mouth daily.    [provider]  lovastatin (MEVACOR) 20 MG tablet Take 20 mg by mouth at bedtime.    [provider]  metformin (FORTAMET) 500 MG (OSM) 24 hr tablet Take 500 mg by mouth daily with breakfast.    [provider]  mupirocin ointment (BACTROBAN) 2 % Apply 1 application topically 3 (three) times daily. 08/08/19   05/08/2010, MD    Family History Family History  Problem Relation Age of Onset  . Heart disease Mother   . Heart disease Father     Social History Social History   Tobacco Use  . Smoking status: Never Smoker  . Smokeless tobacco: Never  Used  Substance Use Topics  . Alcohol use: No  . Drug use: No     Allergies   Lipitor [atorvastatin]   Review of Systems Review of Systems  HENT: Positive for ear pain and hearing loss.   Skin: Positive for rash.  All other systems reviewed and are negative.    Physical Exam Triage Vital Signs ED Triage Vitals  Enc Vitals Group     BP      Pulse      Resp      Temp      Temp src      SpO2      Weight      Height      Head Circumference      Peak Flow      Pain Score      Pain Loc      Pain Edu?      Excl. in GC?    No data found.  Updated Vital Signs BP (!) 151/74 (BP Location: Right Arm)   Pulse 76   Temp 98 F  (36.7 C) (Oral)   Wt 56.7 kg   SpO2 96%   BMI 21.46 kg/m    Physical Exam Vitals and nursing note reviewed.  Constitutional:      Appearance: Normal appearance.  HENT:     Head: Normocephalic.     Right Ear: External ear normal. There is impacted cerumen.     Left Ear: Tympanic membrane, ear canal and external ear normal.  Eyes:     Conjunctiva/sclera: Conjunctivae normal.  Cardiovascular:     Rate and Rhythm: Normal rate.  Pulmonary:     Effort: Pulmonary effort is normal.  Musculoskeletal:        General: Normal range of motion.     Cervical back: Normal range of motion and neck supple.  Skin:    General: Skin is warm and dry.     Findings: Rash present.     Comments: Pustule on left cheek which was unroofed using No. 11 BP blade.  Neurological:     General: No focal deficit present.     Mental Status: She is alert and oriented to person, place, and time.  Psychiatric:        Mood and Affect: Mood normal.        Thought Content: Thought content normal.        UC Treatments / Results  Labs (all labs ordered are listed, but only abnormal results are displayed) Labs Reviewed - No data to display  EKG   Radiology No results found.  Procedures Procedures (including critical care time)  Medications Ordered in UC Medications - No data to display  Initial Impression / Assessment and Plan / UC Course  I have reviewed the triage vital signs and the nursing notes.  Pertinent labs & imaging results that were available during my care of the patient were reviewed by me and considered in my medical decision making (see chart for details).    Final Clinical Impressions(s) / UC Diagnoses   Final diagnoses:  Skin pustule  Impacted cerumen of right ear     Discharge Instructions     Use moist warm compresses on left cheek several times a day and then cover with antibiotic cream. If the pustule persists for 5 or 6 days, come back to see me.    ED  Prescriptions    Medication Sig Dispense Auth. Provider   mupirocin ointment (BACTROBAN) 2 % Apply  1 application topically 3 (three) times daily. 22 g Robyn Haber, MD     I have reviewed the PDMP during this encounter.   Robyn Haber, MD 08/08/19 1556

## 2019-08-08 NOTE — Discharge Instructions (Addendum)
Use moist warm compresses on left cheek several times a day and then cover with antibiotic cream. If the pustule persists for 5 or 6 days, come back to see me.

## 2019-08-08 NOTE — ED Triage Notes (Signed)
Patient states that she needs her ears cleaned out.

## 2020-04-20 ENCOUNTER — Emergency Department
Admission: EM | Admit: 2020-04-20 | Discharge: 2020-04-20 | Disposition: A | Discharge status: Home / Self Care | Payer: Medicare Other | Source: Home / Self Care

## 2020-04-20 ENCOUNTER — Other Ambulatory Visit: Payer: Self-pay

## 2020-04-20 DIAGNOSIS — H6123 Impacted cerumen, bilateral: Secondary | ICD-10-CM

## 2020-04-20 NOTE — ED Triage Notes (Signed)
Pt c/o wax build up in both ears. Hx of cerumen impaction. Does not see ENT.

## 2022-01-09 ENCOUNTER — Encounter: Payer: Self-pay | Admitting: Emergency Medicine

## 2022-01-09 ENCOUNTER — Emergency Department (INDEPENDENT_AMBULATORY_CARE_PROVIDER_SITE_OTHER)
Admission: EM | Admit: 2022-01-09 | Discharge: 2022-01-09 | Disposition: A | Discharge status: Home / Self Care | Payer: Medicare HMO | Source: Home / Self Care

## 2022-01-09 DIAGNOSIS — H6123 Impacted cerumen, bilateral: Secondary | ICD-10-CM

## 2022-01-09 NOTE — ED Triage Notes (Signed)
Patient states that she has ear wax in both ears x 2-3 days, denies any pain.  Patient does wear hearing aids in both ears.

## 2022-01-09 NOTE — ED Provider Notes (Signed)
[facility]    CSN: [id number] Arrival date & time: 01/09/22  1322      History   Chief Complaint Chief Complaint  Patient presents with   Cerumen Impaction    HPI Lisia Westbay is a 81 y.o. female.   HPI Very pleasant 81 year old female presents with cerumen impaction for 2-3 days.  PMH significant for hearing impairment, T2DM, HTN, HLD.  Past Medical History:  Diagnosis Date   Diabetes mellitus    Hearing impairment    Hyperlipidemia    Hypertension    Thyroid disease     Patient Active Problem List   Diagnosis Date Noted   Carpal tunnel syndrome of right wrist 04/28/2013   Closed fracture of right radius and ulna 02/09/2013   URI 02/10/2011   CONTACT DERMATITIS 12/12/2010   DIABETES MELLITUS, TYPE II 03/08/2010   HYPERLIPIDEMIA 03/08/2010   HYPERTENSION 03/08/2010   CELLULITIS, ARM 03/08/2010   BEE STING REACTION, LOCAL 03/08/2010    History reviewed. No pertinent surgical history.  OB History   No obstetric history on file.      Home Medications    Prior to Admission medications   Medication Sig Start Date End Date Taking? Authorizing Provider  alendronate (FOSAMAX) 70 MG tablet Take 70 mg by mouth every 7 (seven) days. Take with a full glass of water on an empty stomach.   Yes [provider]  brimonidine (ALPHAGAN P) 0.1 % SOLN Place 1 drop into both eyes 2 (two) times daily.   Yes [provider]  calcium carbonate (OS-CAL) 600 MG TABS Take 600 mg by mouth daily.   Yes [provider]  levothyroxine (SYNTHROID, LEVOTHROID) 50 MCG tablet Take 50 mcg by mouth daily.   Yes [provider]  lisinopril (PRINIVIL,ZESTRIL) 20 MG tablet Take 20 mg by mouth daily.   Yes [provider]  lovastatin (MEVACOR) 20 MG tablet Take 20 mg by mouth at bedtime.   Yes [provider]  metformin (FORTAMET) 500 MG (OSM) 24 hr tablet Take 500 mg by mouth daily with breakfast.   Yes [provider]   mupirocin ointment (BACTROBAN) 2 % Apply 1 application topically 3 (three) times daily. 08/08/19  Yes Elvina Sidle, MD    Family History Family History  Problem Relation Age of Onset   Heart disease Mother    Heart disease Father     Social History Social History   Tobacco Use   Smoking status: Never   Smokeless tobacco: Never  Substance Use Topics   Alcohol use: No   Drug use: No     Allergies   Other, Lipitor [atorvastatin], and Omeprazole   Review of Systems Review of Systems  HENT:         Bilateral cerumen impaction  All other systems reviewed and are negative.    Physical Exam Triage Vital Signs ED Triage Vitals  Enc Vitals Group     BP 01/09/22 1343 (!) 145/82     Pulse Rate 01/09/22 1343 81     Resp 01/09/22 1343 18     Temp 01/09/22 1343 99.3 F (37.4 C)     Temp Source 01/09/22 1343 Oral     SpO2 01/09/22 1343 96 %     Weight 01/09/22 1345 128 lb (58.1 kg)     Height 01/09/22 1345 5' 4.5" (1.638 m)     Head Circumference --      Peak Flow --      Pain Score 01/09/22  1344 0     Pain Loc --      Pain Edu? --      Excl. in GC? --    No data found.  Updated Vital Signs BP (!) 145/82 (BP Location: Right Arm)   Pulse 81   Temp 99.3 F (37.4 C) (Oral)   Resp 18   Ht 5' 4.5" (1.638 m)   Wt 128 lb (58.1 kg)   SpO2 96%   BMI 21.63 kg/m     Physical Exam Vitals reviewed.  Constitutional:      General: She is not in acute distress.    Appearance: Normal appearance. She is normal weight. She is not ill-appearing.  HENT:     Head: Normocephalic and atraumatic.     Right Ear: External ear normal.     Left Ear: External ear normal.     Ears:     Comments: EAC's occluded with cerumen unable to visualize either TM; post bilateral ear lavage: Left EAC-clear, Left TM-clear, retracted; Right EAC-clear, Right TM-cloudy, retracted    Mouth/Throat:     Mouth: Mucous membranes are moist.     Pharynx: Oropharynx is clear.  Eyes:     Extraocular  Movements: Extraocular movements intact.     Conjunctiva/sclera: Conjunctivae normal.     Pupils: Pupils are equal, round, and reactive to light.  Cardiovascular:     Rate and Rhythm: Normal rate and regular rhythm.     Pulses: Normal pulses.     Heart sounds: Normal heart sounds.  Pulmonary:     Effort: Pulmonary effort is normal.     Breath sounds: Normal breath sounds. No wheezing, rhonchi or rales.  Skin:    General: Skin is warm and dry.  Neurological:     General: No focal deficit present.     Mental Status: She is alert and oriented to person, place, and time. Mental status is at baseline.      UC Treatments / Results  Labs (all labs ordered are listed, but only abnormal results are displayed) Labs Reviewed - No data to display  EKG   Radiology No results found.  Procedures Procedures (including critical care time)  Medications Ordered in UC Medications - No data to display  Initial Impression / Assessment and Plan / UC Course  I have reviewed the triage vital signs and the nursing notes.  Pertinent labs & imaging results that were available during my care of the patient were reviewed by me and considered in my medical decision making (see chart for details).     MDM: 1.  Bilateral impacted cerumen-resolved with bilateral ear lavage in clinic prior to discharge. Advised patient if symptoms worsen and/or unresolved please follow-up with PCP or here for further evaluation.  Patient discharged home, hemodynamically stable.  Final Clinical Impressions(s) / UC Diagnoses   Final diagnoses:  Bilateral impacted cerumen     Discharge Instructions      Advised patient if symptoms worsen and/or unresolved please follow-up with PCP or here for further evaluation.     ED Prescriptions   None    PDMP not reviewed this encounter.   Trevor Iha, FNP 01/09/22 1425

## 2022-01-09 NOTE — Discharge Instructions (Addendum)
Advised patient if symptoms worsen and/or unresolved please follow-up with PCP or here for further evaluation.
